# Patient Record
Sex: Male | Born: 1935 | Marital: Married | Smoking: Former smoker
Health system: Southern US, Community
[De-identification: ages and names within clinical notes are randomized; demographics above are authoritative.]

## PROBLEM LIST (undated history)

## (undated) DIAGNOSIS — K635 Polyp of colon: Secondary | ICD-10-CM

## (undated) DIAGNOSIS — I509 Heart failure, unspecified: Secondary | ICD-10-CM

## (undated) DIAGNOSIS — Z87442 Personal history of urinary calculi: Secondary | ICD-10-CM

## (undated) DIAGNOSIS — J449 Chronic obstructive pulmonary disease, unspecified: Secondary | ICD-10-CM

## (undated) DIAGNOSIS — I2510 Atherosclerotic heart disease of native coronary artery without angina pectoris: Secondary | ICD-10-CM

## (undated) DIAGNOSIS — I4891 Unspecified atrial fibrillation: Secondary | ICD-10-CM

## (undated) DIAGNOSIS — J479 Bronchiectasis, uncomplicated: Secondary | ICD-10-CM

## (undated) HISTORY — PX: OTHER SURGICAL HISTORY: SHX169

## (undated) HISTORY — DX: Polyp of colon: K63.5

## (undated) HISTORY — DX: Personal history of urinary calculi: Z87.442

## (undated) HISTORY — DX: Bronchiectasis, uncomplicated: J47.9

## (undated) HISTORY — DX: Atherosclerotic heart disease of native coronary artery without angina pectoris: I25.10

## (undated) HISTORY — PX: COLONOSCOPY: SHX174

## (undated) HISTORY — PX: CATARACT EXTRACTION, BILATERAL: SHX1313

## (undated) HISTORY — DX: Unspecified atrial fibrillation: I48.91

## (undated) HISTORY — DX: Heart failure, unspecified: I50.9

## (undated) HISTORY — DX: Chronic obstructive pulmonary disease, unspecified: J44.9

---

## 2013-02-06 DIAGNOSIS — I369 Nonrheumatic tricuspid valve disorder, unspecified: Secondary | ICD-10-CM

## 2013-02-07 ENCOUNTER — Other Ambulatory Visit: Payer: Self-pay | Admitting: Cardiovascular Disease

## 2013-02-07 DIAGNOSIS — I509 Heart failure, unspecified: Secondary | ICD-10-CM

## 2013-02-07 DIAGNOSIS — I4891 Unspecified atrial fibrillation: Secondary | ICD-10-CM

## 2013-02-08 DIAGNOSIS — I4892 Unspecified atrial flutter: Secondary | ICD-10-CM

## 2013-02-11 DIAGNOSIS — I4891 Unspecified atrial fibrillation: Secondary | ICD-10-CM

## 2013-03-02 ENCOUNTER — Encounter: Payer: Self-pay | Admitting: Pulmonary Disease

## 2013-03-02 ENCOUNTER — Ambulatory Visit (INDEPENDENT_AMBULATORY_CARE_PROVIDER_SITE_OTHER): Payer: Medicare Other | Admitting: Pulmonary Disease

## 2013-03-02 VITALS — BP 120/60 | HR 67 | Temp 97.5°F | Ht 72.0 in | Wt 133.0 lb

## 2013-03-02 DIAGNOSIS — J449 Chronic obstructive pulmonary disease, unspecified: Secondary | ICD-10-CM

## 2013-03-02 DIAGNOSIS — J479 Bronchiectasis, uncomplicated: Secondary | ICD-10-CM

## 2013-03-02 NOTE — Assessment & Plan Note (Signed)
Due to prior heavy tobacco use.  GOLD grade D based on severity of symptoms.  I do not believe that he is having a flare of his COPD today, but it seems that his bronchiectasis is flaring now.  Overall, he seems very sick from a respiratory standpoint and has had a significant functional decline in the last year.  Plan: -continue spiriva, advair -continue 4L O2 continuously -pulmonary rehab referral

## 2013-03-02 NOTE — Assessment & Plan Note (Signed)
According to the patient and the minimal records I have available, it appears that Mr. 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  I explained to him today that he needs to continue the monthly IVIg as coordinated by his physician's from [facility].  The three hallmarks of good bronchiectasis care are 1) good nutrition, 2) airway clearance, 3) focus on pulmonary microbiology.    I believe that he has a flare of his bronchiectasis right now, and would benefit from a longer course of antibiotics.  Plan: -advised to start using a flutter valve tid -use Levaquin for 14 days -after completing the Levaquin, he needs sputum cultures sent for bacterial and afb -d/c prednisone for now -continue spiriva, advair, albuterol -encouraged ensure with meals and to follow dietician instructions carefully

## 2013-03-02 NOTE — Progress Notes (Signed)
Subjective:    Patient ID: Johnny Wilson, male    DOB: Feb 04, 1931, 79 y.o.   MRN: [id number]  HPI This is a pleasant 79 y/o male who lives in [place] who has CVID and bronchiectasis who is coming to [place] to establish care for the same.  He states that he has been on IVIg through [facility] for 6-7 years for the CVID under the direction of a rheumatologist.  He has chronic pseudomonas colonization and has required treatment of bronchiectasis flares with IV antibiotics through a PICC line for several weeks in the past.  He has never seen [facility] pulmonary, to his knowledge.   He smoked 1-1.5 ppd for at least 55 years and quit in 2002.    In the last several months he has had a significant decline in his health and has been admitted to both [facility] and [facility] for several weeks for both exacerbations of CHF and bronchiectasis.  He has been on oxygen during this time at 4L continuosly.  He was discharged to a nursing facility several weeks ago and has been participating in rehab there on a regular basis.  He is due to go home this week.  He would like to establish care in [place] with our clinic and Dr. Windell Hummingbird cardiology clinic because we are in a more convenient location than [facility].  He states that he has a cough on a daily basis productive of grey sputum and he cannot walk further than 50 yards without stopping due to dyspnea.  His weight has been stable around 133-135 lbs in the nursing home.  He is eating ensure with meals.  Though his legs were swollen during his recent CHF exacerbations, this symptom has been better in the last few weeks.  He uses spiriva, advair, and his oxygen regularly.  He does not use a flutter valve regularly.    Past Medical History  Diagnosis Date  . COPD (chronic obstructive pulmonary disease)      History reviewed. No pertinent family history.   History   Social History  . Marital Status: Married    Spouse Name: N/A    Number of Children: N/A  . Years of Education: N/A    Occupational History  . Not on file.   Social History Main Topics  . Smoking status: Former Smoker -- 1.50 packs/day for 55 years    Types: Cigarettes    Quit date: 12/31/2002  . Smokeless tobacco: Former Neurosurgeon    Types: Chew  . Alcohol Use: No  . Drug Use: No  . Sexually Active: Not on file   Other Topics Concern  . Not on file   Social History Narrative  . No narrative on file     Allergies not on file   No outpatient prescriptions prior to visit.   No facility-administered medications prior to visit.      Review of Systems  Constitutional: Negative for fever and unexpected weight change.  HENT: Positive for congestion, trouble swallowing, postnasal drip and sinus pressure. Negative for ear pain, nosebleeds, sore throat, rhinorrhea, sneezing and dental problem.   Eyes: Positive for redness and itching.  Respiratory: Positive for cough and shortness of breath. Negative for chest tightness and wheezing.   Cardiovascular: Positive for palpitations and leg swelling.  Gastrointestinal: Negative for nausea and vomiting.  Genitourinary: Negative for dysuria.  Musculoskeletal: Negative for joint swelling.  Skin: Negative for rash.  Neurological: Negative for headaches.  Hematological: Bruises/bleeds easily.  Psychiatric/Behavioral: Positive for dysphoric mood. The  patient is nervous/anxious.        Objective:   Physical Exam  Filed Vitals:   03/02/13 1013  BP: 120/60  Pulse: 67  Temp: 97.5 F (36.4 C)  TempSrc: Oral  Height: 6' (1.829 m)  Weight: 133 lb (60.328 kg)  SpO2: 96%  On 4L [place]  Gen: chronically ill appearing, no acute distress HEENT: NCAT, OP clear, neck supple without masses Eyes:  PERRL, EOMi PULM: Insp, coarse crackles from bases to 1/2 way up bilaterally, no wheezing; normal percussion bilaterally CV: RRR, no mgr, no JVD AB: BS+, soft, nontender, no hsm Ext: warm, trace pretibial edema, + clubbing, no cyanosis Derm: no rash or skin  breakdown Neuro: A&Ox4, CN II-XII intact, strength 5/5 in all 4 extremities  03/02/13 Simple spirometry> clear obstruction, FEV1 1.45 L     Assessment & Plan:   Bronchiectasis without acute exacerbation According to the patient and the minimal records I have available, it appears that Johnny Wilson 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  I explained to him today that he needs to continue the monthly IVIg as coordinated by his physician's from [facility].  The three hallmarks of good bronchiectasis care are 1) good nutrition, 2) airway clearance, 3) focus on pulmonary microbiology.    I believe that he has a flare of his bronchiectasis right now, and would benefit from a longer course of antibiotics.  Plan: -advised to start using a flutter valve tid -use Levaquin for 14 days -after completing the Levaquin, he needs sputum cultures sent for bacterial and afb -d/c prednisone for now -continue spiriva, advair, albuterol -encouraged ensure with meals and to follow dietician instructions carefully  COPD with chronic bronchitis Due to prior heavy tobacco use.  GOLD grade D based on severity of symptoms.  I do not believe that he is having a flare of his COPD today, but it seems that his bronchiectasis is flaring now.  Overall, he seems very sick from a respiratory standpoint and has had a significant functional decline in the last year.  Plan: -continue spiriva, advair -continue 4L O2 continuously -pulmonary rehab referral   Updated Medication List Outpatient Encounter Prescriptions as of 03/02/2013  Medication Sig Dispense Refill  . dextromethorphan-guaiFENesin (ROBITUSSIN COLD COUGH+ CHEST) 10-100 MG/5ML liquid Take 5 mLs by mouth every 4 (four) hours as needed for cough.      . digoxin (LANOXIN) 0.125 MG tablet Take 0.125 mg by mouth daily.      Marland Kitchen diltiazem (DILACOR XR) 240 MG 24 hr capsule Take 240 mg by mouth daily.       . fluconazole (DIFLUCAN) 100 MG tablet Take 100 mg by mouth daily.      . furosemide (LASIX) 20 MG tablet Take 20 mg by mouth daily.      . predniSONE (DELTASONE) 5 MG tablet Take 5 mg by mouth daily.      . Rivaroxaban (XARELTO) 20 MG TABS Take 20 mg by mouth daily.      Marland Kitchen tiotropium (SPIRIVA) 18 MCG inhalation capsule Place 18 mcg into inhaler and inhale daily.       No facility-administered encounter medications on file as of 03/02/2013.

## 2013-03-02 NOTE — Patient Instructions (Signed)
Take the levaquin 750mg  daily through 4/25 Stop taking the prednisone Use your flutter valve three times a day Start going to pulmonary rehab at [facility] (we will place a referral)  We will see you back in 2 weeks in the [place] office

## 2013-03-04 ENCOUNTER — Encounter: Payer: Medicare Other | Admitting: Cardiovascular Disease

## 2013-03-09 ENCOUNTER — Ambulatory Visit (INDEPENDENT_AMBULATORY_CARE_PROVIDER_SITE_OTHER): Payer: Medicare Other | Admitting: Cardiovascular Disease

## 2013-03-09 ENCOUNTER — Encounter: Payer: Self-pay | Admitting: Cardiovascular Disease

## 2013-03-09 VITALS — BP 120/68 | HR 84 | Ht 72.0 in | Wt 135.5 lb

## 2013-03-09 DIAGNOSIS — I5032 Chronic diastolic (congestive) heart failure: Secondary | ICD-10-CM | POA: Insufficient documentation

## 2013-03-09 DIAGNOSIS — I359 Nonrheumatic aortic valve disorder, unspecified: Secondary | ICD-10-CM

## 2013-03-09 DIAGNOSIS — I4891 Unspecified atrial fibrillation: Secondary | ICD-10-CM

## 2013-03-09 DIAGNOSIS — J449 Chronic obstructive pulmonary disease, unspecified: Secondary | ICD-10-CM

## 2013-03-09 DIAGNOSIS — I350 Nonrheumatic aortic (valve) stenosis: Secondary | ICD-10-CM | POA: Insufficient documentation

## 2013-03-09 DIAGNOSIS — I509 Heart failure, unspecified: Secondary | ICD-10-CM

## 2013-03-09 MED ORDER — RIVAROXABAN 20 MG PO TABS: 20.0000 mg | ORAL_TABLET | Freq: Every day | ORAL | Status: DC

## 2013-03-09 NOTE — Patient Instructions (Addendum)
You are doing well. No medication changes were made.  Call the office if your ankles are swelling more, We would have you take extra lasix for fluid retention  Lab work today  Please call us if you have new issues that need to be addressed before your next appt.  Your physician wants you to follow-up in: 3 months.  You will receive a reminder letter in the mail two months in advance. If you don't receive a letter, please call our office to schedule the follow-up appointment.

## 2013-03-09 NOTE — Assessment & Plan Note (Signed)
Mild aortic valve stenosis. We'll continue rate control and Lasix daily.

## 2013-03-09 NOTE — Assessment & Plan Note (Signed)
He is being followed by Dr. Kendrick Fries. On 4 L oxygen. Using Advair, Spiriva, albuterol

## 2013-03-09 NOTE — Assessment & Plan Note (Signed)
Continue Lasix daily. No edema on today's visit. We'll check a basic metabolic panel.

## 2013-03-09 NOTE — Assessment & Plan Note (Signed)
Currently in normal sinus rhythm. High risk of developing atrial fibrillation again given his significant underlying COPD. We'll continue him on anticoagulation for now. Unable to exclude paroxysmal atrial fibrillation.

## 2013-03-09 NOTE — Progress Notes (Signed)
Patient ID: Johnny Wilson, male    DOB: August 01, 1931, 79 y.o.   MRN: [id number]  HPI Comments: Johnny Wilson is an 79 year old gentleman with severe COPD, long smoking history,on chronic oxygen, chronic atrial fibrillation, diastolic CHF, recent admission to [facility] for diastolic CHF, pulmonary hypertension treated with diuretics and steroids and discharged to nursing facility, noted to have atrial fibrillation with RVR and readmitted to [facility]. Heart rate 135 beats per minute. He was dropping his oxygen saturation level to the high 60s.  CT scan showedSevere atelectasis, patchy infiltrates, bilateral pleural effusions. He was started on diltiazem infusion, changed to diltiazem CD by mouth and digoxin. Also started on xarelto 20 mg daily. He was discharged to [facility] the head for 3 weeks. Now living at home.  He is still weak, but much stronger than several weeks ago given his long hospital course. Overall has no complaints. He has lost significant weight through his hospital course. Appetite is adequate.  Echocardiogram in the hospital shows ejection fraction 55-60%, mild aortic valve stenosis, mild pulmonary hypertension Normal creatinine  EKG in the hospital showed atrial flutter with variable block, ventricular rate 92 beats per minute  EKG today shows normal sinus rhythm with frequent APCs, rate 84 beats per minute   Outpatient Encounter Prescriptions as of 03/09/2013  Medication Sig Dispense Refill  . digoxin (LANOXIN) 0.125 MG tablet Take 0.125 mg by mouth daily.      Marland Kitchen diltiazem (DILACOR XR) 240 MG 24 hr capsule Take 240 mg by mouth daily.      . furosemide (LASIX) 20 MG tablet Take 20 mg by mouth daily.      Marland Kitchen ipratropium-albuterol (DUONEB) 0.5-2.5 (3) MG/3ML SOLN Take 3 mLs by nebulization as needed.      . Rivaroxaban (XARELTO) 20 MG TABS Take 1 tablet (20 mg total) by mouth daily.  30 tablet  6  . tiotropium (SPIRIVA) 18 MCG inhalation capsule Place 18 mcg into inhaler and inhale daily.         Review of Systems  Constitutional: Negative.   HENT: Negative.   Eyes: Negative.   Respiratory: Positive for shortness of breath and wheezing.   Cardiovascular: Negative.   Gastrointestinal: Negative.   Musculoskeletal: Negative.   Skin: Negative.   Neurological: Negative.   Psychiatric/Behavioral: Negative.   All other systems reviewed and are negative.    BP 120/68  Pulse 84  Ht 6' (1.829 m)  Wt 135 lb 8 oz (61.462 kg)  BMI 18.37 kg/m2  Physical Exam  Nursing note and vitals reviewed. Constitutional: He is oriented to person, place, and time.  An elderly gentleman on oxygen  HENT:  Head: Normocephalic.  Nose: Nose normal.  Mouth/Throat: Oropharynx is clear and moist.  Eyes: Conjunctivae are normal. Pupils are equal, round, and reactive to light.  Neck: Normal range of motion. Neck supple. No JVD present.  Cardiovascular: Normal rate, regular rhythm, S1 normal, S2 normal, normal heart sounds and intact distal pulses.   Occasional extrasystoles are present. Exam reveals no gallop and no friction rub.   No murmur heard. Pulmonary/Chest: Effort normal. No respiratory distress. He has decreased breath sounds. He has no wheezes. He has no rales. He exhibits no tenderness.  Abdominal: Soft. Bowel sounds are normal. He exhibits no distension. There is no tenderness.  Musculoskeletal: Normal range of motion. He exhibits no edema and no tenderness.  Lymphadenopathy:    He has no cervical adenopathy.  Neurological: He is alert and oriented to person, place, and  time. Coordination normal.  Skin: Skin is warm and dry. No rash noted. No erythema.  Psychiatric: He has a normal mood and affect. His behavior is normal. Judgment and thought content normal.      Assessment and Plan

## 2013-03-10 LAB — BASIC METABOLIC PANEL
BUN: 43 mg/dL — ABNORMAL HIGH (ref 8–27)
Chloride: 99 mmol/L (ref 97–108)
GFR calc Af Amer: 93 mL/min/{1.73_m2} (ref >59)
GFR calc non Af Amer: 81 mL/min/{1.73_m2} (ref >59)
Glucose: 73 mg/dL (ref 65–99)

## 2013-03-19 ENCOUNTER — Other Ambulatory Visit: Payer: Self-pay | Admitting: Emergency Medicine

## 2013-03-19 ENCOUNTER — Telehealth: Payer: Self-pay

## 2013-03-19 ENCOUNTER — Other Ambulatory Visit: Payer: Self-pay | Admitting: Family Medicine

## 2013-03-19 DIAGNOSIS — I4891 Unspecified atrial fibrillation: Secondary | ICD-10-CM

## 2013-03-19 DIAGNOSIS — I5033 Acute on chronic diastolic (congestive) heart failure: Secondary | ICD-10-CM

## 2013-03-19 NOTE — Telephone Encounter (Signed)
Pt's dtr called Concerned b/c pt's HR=[id number] this am She is checking this via pulse ox. I had dtr check HR radially while I was on the phone. She confirms pulse is "too fast to count" O2 level=77 % on 2L [place] C/o feeling SOB I advised she call 911 and have pt go to ER Understanding verb She will do this now

## 2013-03-19 NOTE — Telephone Encounter (Signed)
EMS at pt's home Says they do not think pt needs to go to ER Says HR is sinus tachycardia ranging from 120-140 BPM O2 sats are reading 74% on 2LO2, but EMT states, "I dont believe this is accurate" I discussed with Dr. Kirke Corin who says pt should still be evaluated in ER EMS was informed and will transport to [facility]

## 2013-03-24 ENCOUNTER — Other Ambulatory Visit: Payer: Self-pay | Admitting: Cardiovascular Disease

## 2013-03-24 ENCOUNTER — Encounter: Payer: Self-pay | Admitting: *Deleted

## 2013-03-24 ENCOUNTER — Other Ambulatory Visit: Payer: Self-pay

## 2013-03-24 ENCOUNTER — Ambulatory Visit: Payer: Medicare Other | Admitting: Pulmonary Disease

## 2013-03-24 DIAGNOSIS — I4891 Unspecified atrial fibrillation: Secondary | ICD-10-CM

## 2013-03-24 MED ORDER — AMIODARONE HCL 400 MG PO TABS: ORAL_TABLET | ORAL | Status: DC

## 2013-03-24 MED ORDER — AMIODARONE HCL 200 MG PO TABS: 200.0000 mg | ORAL_TABLET | Freq: Two times a day (BID) | ORAL | Status: DC

## 2013-03-30 ENCOUNTER — Encounter: Payer: Self-pay | Admitting: Pulmonary Disease

## 2013-03-30 ENCOUNTER — Encounter: Payer: Self-pay | Admitting: Physician Assistant

## 2013-03-30 ENCOUNTER — Ambulatory Visit (INDEPENDENT_AMBULATORY_CARE_PROVIDER_SITE_OTHER): Payer: Medicare Other | Admitting: Pulmonary Disease

## 2013-03-30 ENCOUNTER — Ambulatory Visit (INDEPENDENT_AMBULATORY_CARE_PROVIDER_SITE_OTHER): Payer: Medicare Other | Admitting: Physician Assistant

## 2013-03-30 VITALS — BP 120/56 | HR 80 | Ht 72.0 in | Wt 139.2 lb

## 2013-03-30 VITALS — BP 110/60 | HR 82 | Temp 97.9°F | Ht 72.0 in | Wt 138.0 lb

## 2013-03-30 DIAGNOSIS — J329 Chronic sinusitis, unspecified: Secondary | ICD-10-CM

## 2013-03-30 DIAGNOSIS — J9610 Chronic respiratory failure, unspecified whether with hypoxia or hypercapnia: Secondary | ICD-10-CM | POA: Insufficient documentation

## 2013-03-30 DIAGNOSIS — I480 Paroxysmal atrial fibrillation: Secondary | ICD-10-CM | POA: Insufficient documentation

## 2013-03-30 DIAGNOSIS — I4891 Unspecified atrial fibrillation: Secondary | ICD-10-CM

## 2013-03-30 DIAGNOSIS — J449 Chronic obstructive pulmonary disease, unspecified: Secondary | ICD-10-CM

## 2013-03-30 DIAGNOSIS — I5032 Chronic diastolic (congestive) heart failure: Secondary | ICD-10-CM

## 2013-03-30 DIAGNOSIS — I350 Nonrheumatic aortic (valve) stenosis: Secondary | ICD-10-CM

## 2013-03-30 DIAGNOSIS — J479 Bronchiectasis, uncomplicated: Secondary | ICD-10-CM

## 2013-03-30 DIAGNOSIS — J4489 Other specified chronic obstructive pulmonary disease: Secondary | ICD-10-CM

## 2013-03-30 DIAGNOSIS — I359 Nonrheumatic aortic valve disorder, unspecified: Secondary | ICD-10-CM

## 2013-03-30 DIAGNOSIS — I509 Heart failure, unspecified: Secondary | ICD-10-CM

## 2013-03-30 MED ORDER — FUROSEMIDE 20 MG PO TABS: 20.0000 mg | ORAL_TABLET | Freq: Every day | ORAL | Status: DC

## 2013-03-30 MED ORDER — AMIODARONE HCL 200 MG PO TABS: 200.0000 mg | ORAL_TABLET | Freq: Every day | ORAL | Status: DC

## 2013-03-30 MED ORDER — FLUTICASONE PROPIONATE 50 MCG/ACT NA SUSP: 1.0000 | Freq: Every day | NASAL | Status: AC

## 2013-03-30 MED ORDER — FLUTICASONE-SALMETEROL 250-50 MCG/DOSE IN AEPB: 2.0000 | INHALATION_SPRAY | Freq: Two times a day (BID) | RESPIRATORY_TRACT | Status: AC

## 2013-03-30 MED ORDER — RIVAROXABAN 20 MG PO TABS: 20.0000 mg | ORAL_TABLET | Freq: Every day | ORAL | Status: DC

## 2013-03-30 MED ORDER — FLUTICASONE-SALMETEROL 250-50 MCG/DOSE IN AEPB: 2.0000 | INHALATION_SPRAY | Freq: Two times a day (BID) | RESPIRATORY_TRACT | Status: DC

## 2013-03-30 MED ORDER — TIOTROPIUM BROMIDE MONOHYDRATE 18 MCG IN CAPS: 18.0000 ug | ORAL_CAPSULE | Freq: Every day | RESPIRATORY_TRACT | Status: AC

## 2013-03-30 MED ORDER — TIOTROPIUM BROMIDE MONOHYDRATE 18 MCG IN CAPS: 18.0000 ug | ORAL_CAPSULE | Freq: Every day | RESPIRATORY_TRACT | Status: DC

## 2013-03-30 NOTE — Assessment & Plan Note (Signed)
Add regular saline rinses/sprays

## 2013-03-30 NOTE — Patient Instructions (Addendum)
You are remaining in a normal rhythm today.   Continue to take amiodarone, Xarelto and Lasix as prescribed.   Please continue to wear compression stockings, monitor weights daily, and limit salt and fluid intake.   Please follow-up with your primary doctor in 1-2 weeks and request labwork for your thyroid since starting amiodarone.   We will see you back in 3 months.

## 2013-03-30 NOTE — Assessment & Plan Note (Signed)
Lungs clear. No appreciable wheezing, rales or rhonchi. Breathing subjectively at baseline per patient.

## 2013-03-30 NOTE — Assessment & Plan Note (Signed)
Euvolemic today. Breathing markedly improved. Continue low-dose Lasix. Discussed CHF management/monitoring in detail including daily weights, compression stockings, salt/fluid restriction. Advised to consume potassium-rich foods.

## 2013-03-30 NOTE — Assessment & Plan Note (Signed)
Rate controlled today. Continue amiodarone per Dr. Mariah Milling

## 2013-03-30 NOTE — Assessment & Plan Note (Signed)
This is due to CVID and is severe based on his oxygen use, frequent exacerbations, and airflow obstruction.  Plan: -encouraged good nutrition/weight gain, Ensure with meals -change flutter valve to IPV device 2-3 x/day -sputum culture next visit -IgE next visit (look for ABPA, currently on prednisone so unlikely to be helpful if drawn today)

## 2013-03-30 NOTE — Progress Notes (Signed)
Subjective:    Patient ID: Johnny Wilson, male    DOB: 01-21-1931, 79 y.o.   MRN: [id number]  Synopsis: Shuayb Schepers establish care with the [facility] pulmonary medicine in April of 2014. He has bronchiectasis do to combined variable immunodeficiency and he has received immunoglobulin therapy monthly at [facility] for years. He also has COPD, atrial fibrillation, and CHF. 2013 3 2014 have been exceedingly difficult with multiple hospitalizations, a nursing home stay, and increasing oxygen requirements.  HPI  03/30/2013 ROV -- Unfortunately Chavis has been admitted since our last visit. He says that his cough and sputum production have actually improved, but however on May 1 he woke up in the morning acutely short of breath with a very fast and irregular heart rate. He was admitted to the CCU at [facility] where his heart rate was controlled and he was told that he had multilobar pneumonia. He was treated with Levaquin, amiodarone, and prednisone. He was discharged on a prednisone taper and amiodarone. He states his breathing is a little better since the last visit. His weight is up without significant change and leg swelling. His cough has nearly completely resolved and he does not make sputum. The flutter valve did not help with sputum production. He continues to use and benefit from 4 L of oxygen continuously.  Past Medical History  Diagnosis Date  . COPD (chronic obstructive pulmonary disease)   . CHF (congestive heart failure)   . Bronchiectasis   . CAD (coronary artery disease)   . History of kidney stones   . A-fib      Review of Systems  Constitutional: Positive for fatigue. Negative for fever and chills.  HENT: Positive for congestion, rhinorrhea, postnasal drip and sinus pressure. Negative for sneezing.   Respiratory: Positive for cough and shortness of breath. Negative for wheezing.   Cardiovascular: Positive for palpitations and leg swelling. Negative for chest pain.        Objective:   Physical Exam  Filed Vitals:   03/30/13 1140  BP: 110/60  Pulse: 82  Temp: 97.9 F (36.6 C)  TempSrc: Oral  Height: 6' (1.829 m)  Weight: 138 lb (62.596 kg)  SpO2: 92%   4L [place]  Gen: chronically ill appearing, no acute distress HEENT: NCAT, EOMi, OP clear,  PULM: crackles in bases, minimal wheezing CV: Regular rate, irregular rhythm, no mgr, no JVD AB: BS+, soft, nontender, no hsm Ext: warm, compression stocking in place, no clubbing, no cyanosis      Assessment & Plan:   Bronchiectasis without acute exacerbation This is due to CVID and is severe based on his oxygen use, frequent exacerbations, and airflow obstruction.  Plan: -encouraged good nutrition/weight gain, Ensure with meals -change flutter valve to IPV device 2-3 x/day -sputum culture next visit -IgE next visit (look for ABPA, currently on prednisone so unlikely to be helpful if drawn today)  COPD with chronic bronchitis Severe based on symptoms, frequent hospitalizations, oxygen needs.  Plan: -continue spiriva and advair -continue oxygen -start Pulmonary rehab this week  Atrial fibrillation Rate controlled today. Continue amiodarone per Dr. Mariah Milling  Sinusitis, chronic Add regular saline rinses/sprays    Updated Medication List Outpatient Encounter Prescriptions as of 03/30/2013  Medication Sig Dispense Refill  . amiodarone (PACERONE) 200 MG tablet Take 1 tablet (200 mg total) by mouth 2 (two) times daily.  60 tablet  6  . aspirin 81 MG tablet Take 81 mg by mouth daily.      . Fluticasone-Salmeterol (  ADVAIR) 250-50 MCG/DOSE AEPB Inhale 2 puffs into the lungs every 12 (twelve) hours.  60 each  2  . furosemide (LASIX) 20 MG tablet Take 20 mg by mouth daily.      Marland Kitchen ipratropium-albuterol (DUONEB) 0.5-2.5 (3) MG/3ML SOLN Take 3 mLs by nebulization as needed.      . predniSONE (DELTASONE) 20 MG tablet Take 20 mg by mouth daily.      . Rivaroxaban (XARELTO) 20 MG TABS Take 1 tablet  (20 mg total) by mouth daily.  30 tablet  6  . tiotropium (SPIRIVA) 18 MCG inhalation capsule Place 1 capsule (18 mcg total) into inhaler and inhale daily.  30 capsule  2  . [DISCONTINUED] digoxin (LANOXIN) 0.125 MG tablet Take 0.125 mg by mouth daily.      . [DISCONTINUED] diltiazem (CARDIZEM SR) 60 MG 12 hr capsule Take 60 mg by mouth daily.      . [DISCONTINUED] Fluticasone-Salmeterol (ADVAIR) 250-50 MCG/DOSE AEPB Inhale 2 puffs into the lungs every 12 (twelve) hours.      . [DISCONTINUED] Fluticasone-Salmeterol (ADVAIR) 250-50 MCG/DOSE AEPB Inhale 2 puffs into the lungs every 12 (twelve) hours.  60 each  2  . [DISCONTINUED] levofloxacin (LEVAQUIN) 500 MG tablet Take 500 mg by mouth daily.      . [DISCONTINUED] tiotropium (SPIRIVA) 18 MCG inhalation capsule Place 18 mcg into inhaler and inhale daily.      . [DISCONTINUED] tiotropium (SPIRIVA) 18 MCG inhalation capsule Place 1 capsule (18 mcg total) into inhaler and inhale daily.  30 capsule  2  . fluticasone (FLONASE) 50 MCG/ACT nasal spray Place 1 spray into the nose daily.  16 g  2   No facility-administered encounter medications on file as of 03/30/2013.

## 2013-03-30 NOTE — Assessment & Plan Note (Signed)
Mild by recent echo. Continue to monitor.

## 2013-03-30 NOTE — Assessment & Plan Note (Signed)
Severe based on symptoms, frequent hospitalizations, oxygen needs.  Plan: -continue spiriva and advair -continue oxygen -start Pulmonary rehab this week

## 2013-03-30 NOTE — Assessment & Plan Note (Signed)
Maintaining NSR on low-dose amiodarone. Tolerating well. Will need to keep a close eye on pulmonary status. LFTs on last admission WNL. We discussed checked TFTs. He has a follow-up appointment with his PCP within a week, and prefers to request thyroid studies be performed at this time. Continue Xarelto.

## 2013-03-30 NOTE — Patient Instructions (Addendum)
We will work to get you an IPV nebulizer machine (Intermittent Percussive Ventilator) to use three to four times a day with albuterol  In the meantime, keep using the flutter valve  Use mucinex twice a day  Follow up with pulmonary rehab  Keep taking your spiriva and advair as you are doing  Keep using your oxygen at 4 L continuously  Use saline rinses (Neil Med rinses or [employer]) with distilled water twice a day   We will see you back in 4-6 weeks or sooner if needed

## 2013-03-30 NOTE — Progress Notes (Signed)
Patient ID: Johnny Wilson, male    DOB: Apr 15, 1931, 79 y.o.   MRN: [id number]  HPI Comments: Mr. Lamere is an 79 year old gentleman with PMHx significant for chronic respiratory failure- O2 dependent COPD with pulmonary HTN, severe bronchiectasis, common variable immunodeficiency snydrome, chronic diastolic CHF and paroxysmal atrial fibrillation (on chronic Xarelto anticoagulation).    He was admitted to [facility] 03/19/13 to 03/24/13 for atrial fibrillation with RVR in the setting of acute on chronic respiratory failure. He had a similar admission the previous month. Over the course of 1 week, he reported experiencing increased SOB, cough productive of grey sputum and fevers/chills. On ED presentation, EKG revealed atrial fibrillation/flutter with RVR, rate 150s. WBC 25K. CXR indicated features c/w R mid and upper lung consolidation concerning for pneumonia. D-dimer WNL. He was started on amiodarone gtt, broad spectrum abx and pulmonary toilet. Outpatient Cardizem PO was continued. Digoxin held. He shortly converted back to NSR, however BPs were labile. Cardizem was held and amiodarone transitioned to PO with sustained NSR. He was resumed on Xarelto. He was discharged on PO amiodarone load (400 mg BID) to maintenance dose (200mg  daily). He did develop mild CHF decompensation which improved with low-dose Lasix.   Echo 02/2013: LVEF 55-60%, mild AS, mild pulmonary HTN  He is maintaining NSR on follow-up today. His breathing is much improved-- at baseline. No more cough. No PND, orthopnea, chest pain, palpitations or LE edema. He recently followed up with Dr. Kendrick Fries and received a good pulmonary evaluation. Tolerating medication changes well. Would like refills. LFTs unremarkable on recent admission.  EKG demonstrates NSR, 80 bpm, borderline 1st degree AVB, no ST/T changes.  BP 120/56.  Current medications   Medication Sig Start Date End Date Taking? Authorizing Provider  amiodarone (PACERONE) 200 MG  tablet Take 1 tablet (200 mg total) by mouth daily. 03/30/13  Yes Katessa Attridge A Valori Hollenkamp, PA-C  aspirin 81 MG tablet Take 81 mg by mouth daily.   Yes Historical Provider, MD  fluticasone (FLONASE) 50 MCG/ACT nasal spray Place 1 spray into the nose daily. 03/30/13 03/30/14 Yes Lupita Leash, MD  Fluticasone-Salmeterol (ADVAIR) 250-50 MCG/DOSE AEPB Inhale 2 puffs into the lungs every 12 (twelve) hours. 03/30/13  Yes Lupita Leash, MD  furosemide (LASIX) 20 MG tablet Take 1 tablet (20 mg total) by mouth daily. 03/30/13  Yes Joziah Dollins A Brynlee Pennywell, PA-C  ipratropium-albuterol (DUONEB) 0.5-2.5 (3) MG/3ML SOLN Take 3 mLs by nebulization as needed.   Yes Historical Provider, MD  NON FORMULARY Oxygen 4 liters daily.   Yes Historical Provider, MD  predniSONE (DELTASONE) 20 MG tablet Take 20 mg by mouth daily. Does not take on Wednesday.   Yes Historical Provider, MD  Rivaroxaban (XARELTO) 20 MG TABS Take 1 tablet (20 mg total) by mouth daily. 03/30/13  Yes Kemo Spruce A Cerina Leary, PA-C  tiotropium (SPIRIVA) 18 MCG inhalation capsule Place 1 capsule (18 mcg total) into inhaler and inhale daily. 03/30/13  Yes Lupita Leash, MD     Review of Systems  Constitutional: Negative.  Negative for unexpected weight change.  HENT: Negative.   Eyes: Negative.   Respiratory: Positive for shortness of breath and wheezing.   Cardiovascular: Negative.  Negative for chest pain, palpitations and leg swelling.  Gastrointestinal: Negative.   Musculoskeletal: Negative.   Skin: Negative.   Neurological: Negative.  Negative for dizziness, syncope and light-headedness.  Psychiatric/Behavioral: Negative.   All other systems reviewed and are negative.    BP 120/56  Pulse 80  Ht 6' (  1.829 m)  Wt 139 lb 4 oz (63.163 kg)  BMI 18.88 kg/m2  Physical Exam  Nursing note and vitals reviewed. Constitutional: He is oriented to person, place, and time.  An elderly gentleman on oxygen  HENT:  Head: Normocephalic.  Nose: Nose normal.   Mouth/Throat: Oropharynx is clear and moist.  Eyes: Conjunctivae are normal. Pupils are equal, round, and reactive to light.  Neck: Normal range of motion. Neck supple. No JVD present.  Cardiovascular: Normal rate, regular rhythm, S1 normal, S2 normal, normal heart sounds and intact distal pulses.   Occasional extrasystoles are present. Exam reveals no gallop and no friction rub.   No murmur heard. Pulmonary/Chest: Effort normal. No respiratory distress. He has decreased breath sounds. He has no wheezes. He has no rales. He exhibits no tenderness.  Abdominal: Soft. Bowel sounds are normal. He exhibits no distension. There is no tenderness.  Musculoskeletal: Normal range of motion. He exhibits no edema and no tenderness.  Lymphadenopathy:    He has no cervical adenopathy.  Neurological: He is alert and oriented to person, place, and time. Coordination normal.  Skin: Skin is warm and dry. No rash noted. No erythema.  Psychiatric: He has a normal mood and affect. His behavior is normal. Judgment and thought content normal.      Assessment and Plan

## 2013-04-07 ENCOUNTER — Telehealth: Payer: Self-pay

## 2013-04-07 NOTE — Telephone Encounter (Signed)
Pt's dtr called with concerns about pt's meds. Says he was d/c from [facility] on amiodarone 400 mg BID until 5/8 then he was to switch to 200 mg daily.  He came to see Dr. Kendrick Fries 5/12 am and Dr. Kendrick Fries d/c instructions included amiodarone 200 mg BID.  They then came to see [place] that same day and saw PA. Our AVS says amiodarone 200 mg daily.  Pt had been taking amiodarone 200 mg daily RX says "200 mg twice daily" Dtr/wife is concerned and confused about how much amiodarone he is to be taking  I reviewed d/c notes from [facility] and Dr. Elease Hashimoto states pt should take amiodarone 400 mg BID until 5/8 and then decrease to 200 mg daily. This is what pt had been taking I explained, according to Dr. Ulyses Jarred note, pt's medications were entered as if he was taking amiodarone 200 mg BID instead of how he was taking it (200 mg daily) so that is why AVS still says "200 mg twice daily"; Kendrick Fries is wanting Dr. Mariah Milling to continue to manage. I advised, since hospital d/c states pt should now be taking 200 mg daily and this is how he was originally taking it, he should continue 200 mg daily Understanding verb

## 2013-04-07 NOTE — Telephone Encounter (Signed)
FYI

## 2013-04-07 NOTE — Telephone Encounter (Signed)
Agree, thx! 

## 2013-04-08 ENCOUNTER — Encounter: Payer: Self-pay | Admitting: Pulmonary Disease

## 2013-04-28 ENCOUNTER — Ambulatory Visit: Payer: Medicare Other | Admitting: Pulmonary Disease

## 2013-05-04 ENCOUNTER — Telehealth: Payer: Self-pay | Admitting: Pulmonary Disease

## 2013-05-04 NOTE — Telephone Encounter (Signed)
Spoke to pt's daughter. Pt was at [employer] today at [facility]. While do his exercises his O2 level fell to 81%. His daughter states that they have an appointment with BQ tomorrow for a regular ROV and will talk to him about this more in detail then.

## 2013-05-05 ENCOUNTER — Telehealth: Payer: Self-pay | Admitting: *Deleted

## 2013-05-05 ENCOUNTER — Encounter: Payer: Self-pay | Admitting: Pulmonary Disease

## 2013-05-05 ENCOUNTER — Ambulatory Visit (INDEPENDENT_AMBULATORY_CARE_PROVIDER_SITE_OTHER)
Admission: RE | Admit: 2013-05-05 | Discharge: 2013-05-05 | Disposition: A | Discharge status: Home / Self Care | Payer: Medicare Other | Source: Ambulatory Visit | Attending: Pulmonary Disease | Admitting: Pulmonary Disease

## 2013-05-05 ENCOUNTER — Ambulatory Visit (INDEPENDENT_AMBULATORY_CARE_PROVIDER_SITE_OTHER): Payer: Medicare Other | Admitting: Pulmonary Disease

## 2013-05-05 VITALS — BP 104/56 | HR 66 | Temp 97.7°F | Ht 72.0 in | Wt 131.0 lb

## 2013-05-05 DIAGNOSIS — R0602 Shortness of breath: Secondary | ICD-10-CM

## 2013-05-05 DIAGNOSIS — R9389 Abnormal findings on diagnostic imaging of other specified body structures: Secondary | ICD-10-CM

## 2013-05-05 DIAGNOSIS — J449 Chronic obstructive pulmonary disease, unspecified: Secondary | ICD-10-CM

## 2013-05-05 DIAGNOSIS — J471 Bronchiectasis with (acute) exacerbation: Secondary | ICD-10-CM

## 2013-05-05 MED ORDER — LEVOFLOXACIN 500 MG PO TABS: 500.0000 mg | ORAL_TABLET | Freq: Every day | ORAL | Status: AC

## 2013-05-05 MED ORDER — PREDNISONE 10 MG PO TABS: ORAL_TABLET | ORAL | Status: DC

## 2013-05-05 NOTE — Assessment & Plan Note (Signed)
We increased his oxygen to 4 L at rest, 6 L with exertion. He is to continue taking his Spiriva, and Advair as written.

## 2013-05-05 NOTE — Telephone Encounter (Signed)
Can we ask around the office to see if anyone else knows about it?  Here is a website for a device like what I'm talking about: [employer]

## 2013-05-05 NOTE — Assessment & Plan Note (Signed)
As noted above, in March 2014 CT chest at [facility] showed emphysema, significant bronchiectasis, and scarring in the right base. He needs to have a repeat CT chest in one to 2 months to review the scar in the right base again.

## 2013-05-05 NOTE — Assessment & Plan Note (Signed)
Unfortunately, I believe that Johnny Wilson is having another flare of bronchiectasis as his sputum production is picked up as has his oxygen requirements and shortness of breath.  While he appears to have very severe disease as evidenced by his multiple recent hospitalizations, I wonder whether or not there is an underlying atypical mycobacterial infection or allergic problem which is contributing to this. However, the most likely explanation is progression of his bronchiectasis and COPD to a very severe level.   I talked to his family and to him very frankly today about whether or not to continue with aggressive therapy versus considering hospice. I advised I would prefer to continue to look for and treat any cause that is making his bronchiectasis worse. He agreed with that plan for today.  Plan: -Sputum culture -IgE to look for ABPA -Prednisone taper -Levaquin -Chest x-ray -He may need to have another CT scan of his chest as the previous one showed "scarring". There are no clear findings suggestive of malignancy but he did have a scar in the right base which I think needs to be followed.

## 2013-05-05 NOTE — Progress Notes (Signed)
Subjective:    Patient ID: ZAIDE KARDELL, male    DOB: 1931/08/27, 79 y.o.   MRN: [id number]  Synopsis: Jamonta Goerner establish care with the [facility] pulmonary medicine in April of 2014. He has bronchiectasis do to combined variable immunodeficiency and he has received immunoglobulin therapy monthly at [facility] for years. He also has COPD, atrial fibrillation, and CHF. 2013 3 2014 have been exceedingly difficult with multiple hospitalizations, a nursing home stay, and increasing oxygen requirements.  HPI   03/30/2013 ROV -- Unfortunately Damare has been admitted since our last visit. He says that his cough and sputum production have actually improved, but however on May 1 he woke up in the morning acutely short of breath with a very fast and irregular heart rate. He was admitted to the CCU at [facility] where his heart rate was controlled and he was told that he had multilobar pneumonia. He was treated with Levaquin, amiodarone, and prednisone. He was discharged on a prednisone taper and amiodarone. He states his breathing is a little better since the last visit. His weight is up without significant change and leg swelling. His cough has nearly completely resolved and he does not make sputum. The flutter valve did not help with sputum production. He continues to use and benefit from 4 L of oxygen continuously.  05/05/2013 ROV >> Elsie Lincoln has had increasing cough with mucus production in the last 3-4 weeks. This is been particularly bad in the last several days. He has been coughing up more sputum from baseline. His green to brown in color. Is not bloody. This has not been associated with fevers. He does have some increased shortness of breath. His ankle swelling has been slightly worse in the last couple days. He continues to take his Lasix. He uses his oxygen but his family notes that sometimes he will not use it for several minutes at a time. They also note that he does not always use his  medications regularly. He has been going to pulmonary rehabilitation a regular basis.   Past Medical History  Diagnosis Date  . COPD (chronic obstructive pulmonary disease)   . CHF (congestive heart failure)   . Bronchiectasis   . CAD (coronary artery disease)   . History of kidney stones   . A-fib      Review of Systems  Constitutional: Positive for fatigue. Negative for fever and chills.  HENT: Positive for congestion, rhinorrhea, postnasal drip and sinus pressure. Negative for sneezing.   Respiratory: Positive for cough and shortness of breath. Negative for wheezing.   Cardiovascular: Positive for palpitations and leg swelling. Negative for chest pain.       Objective:   Physical Exam   Filed Vitals:   05/05/13 0906  BP: 104/56  Pulse: 66  Temp: 97.7 F (36.5 C)  TempSrc: Oral  Height: 6' (1.829 m)  Weight: 131 lb (59.421 kg)  SpO2: 93%   4L [place]  Walked 500 feet on 6 L nasal cannula and never dropped O2 saturation below 91%  Gen: chronically ill appearing, no acute distress HEENT: NCAT, EOMi, OP clear,  PULM: crackles in bases, minimal wheezing CV: Regular rate, irregular rhythm, no mgr, no JVD AB: BS+, soft, nontender, no hsm Ext: warm, trace edema in ankles bilaterally, no clubbing, no cyanosis      Assessment & Plan:   Bronchiectasis with acute exacerbation Unfortunately, I believe that Aylen is having another flare of bronchiectasis as his sputum production is picked up as  has his oxygen requirements and shortness of breath.  While he appears to have very severe disease as evidenced by his multiple recent hospitalizations, I wonder whether or not there is an underlying atypical mycobacterial infection or allergic problem which is contributing to this. However, the most likely explanation is progression of his bronchiectasis and COPD to a very severe level.   I talked to his family and to him very frankly today about whether or not to continue with  aggressive therapy versus considering hospice. I advised I would prefer to continue to look for and treat any cause that is making his bronchiectasis worse. He agreed with that plan for today.  Plan: -Sputum culture -IgE to look for ABPA -Prednisone taper -Levaquin -Chest x-ray -He may need to have another CT scan of his chest as the previous one showed "scarring". There are no clear findings suggestive of malignancy but he did have a scar in the right base which I think needs to be followed.  Abnormal chest CT As noted above, in March 2014 CT chest at [facility] showed emphysema, significant bronchiectasis, and scarring in the right base. He needs to have a repeat CT chest in one to 2 months to review the scar in the right base again.  COPD with chronic bronchitis  We increased his oxygen to 4 L at rest, 6 L with exertion. He is to continue taking his Spiriva, and Advair as written.    Updated Medication List Outpatient Encounter Prescriptions as of 05/05/2013  Medication Sig Dispense Refill  . amiodarone (PACERONE) 200 MG tablet Take 1 tablet (200 mg total) by mouth daily.  30 tablet  3  . aspirin 81 MG tablet Take 81 mg by mouth daily.      . fluticasone (FLONASE) 50 MCG/ACT nasal spray Place 1 spray into the nose daily.  16 g  2  . Fluticasone-Salmeterol (ADVAIR) 250-50 MCG/DOSE AEPB Inhale 2 puffs into the lungs every 12 (twelve) hours.  60 each  2  . furosemide (LASIX) 20 MG tablet Take 1 tablet (20 mg total) by mouth daily.  30 tablet  3  . ipratropium-albuterol (DUONEB) 0.5-2.5 (3) MG/3ML SOLN Take 3 mLs by nebulization as needed.      . NON FORMULARY Oxygen 4 liters daily.      . predniSONE (DELTASONE) 20 MG tablet Take 20 mg by mouth daily. Does not take on Wednesday.      . Rivaroxaban (XARELTO) 20 MG TABS Take 1 tablet (20 mg total) by mouth daily.  30 tablet  3  . tiotropium (SPIRIVA) 18 MCG inhalation capsule Place 1 capsule (18 mcg total) into inhaler and inhale  daily.  30 capsule  2   No facility-administered encounter medications on file as of 05/05/2013.

## 2013-05-05 NOTE — Telephone Encounter (Signed)
Please let him know that the CXR was not much different from his old studies I have reviewed at [facility].  He will need a CT chest in 6 weeks (no contrast, reason> R lung scarring).  Please order this.  Thanks

## 2013-05-05 NOTE — Telephone Encounter (Signed)
[facility] does not have IPV(nebulizer device). I will forward this to [facility] to send to another DME that may have this. Please advise. Carron Curie, CMA

## 2013-05-05 NOTE — Telephone Encounter (Signed)
Please advise CXR results Dr Kendrick Fries.  Call report from [place] at [facility].  Message will be forwarded again to [facility] with website per [facility]

## 2013-05-05 NOTE — Telephone Encounter (Signed)
Please advise CXR results Dr Kendrick Fries.  Call report from [place] at [facility].

## 2013-05-05 NOTE — Patient Instructions (Signed)
Take the prednisone and Levaquin as written  Use your oxygen at 4 liters at rest, 6 liters with exertion  We will write a prescription for an oxygen concentrator for you  Use the IPV device at least twice a day with the duoneb  Continue to take your medicines as written  We will call you with the results of your blood work  We will send you for a Chest X-ray.  You may need another CT chest depending on the results, we will call you to let you know  Take an extra dose of lasix for two days  We will see you back in 2-4 weeks or sooner if needed

## 2013-05-05 NOTE — Telephone Encounter (Signed)
Pam from [facility] medical supply called stating we sent them an order for pt to get an IPV device, but they do not have these.  Pam can be reached @ [phone]. Leanora Ivanoff

## 2013-05-06 NOTE — Telephone Encounter (Signed)
lmtcb x1 w/ spouse 

## 2013-05-06 NOTE — Telephone Encounter (Signed)
ATC x1  NA

## 2013-05-06 NOTE — Telephone Encounter (Signed)
Left message to call back with the IPV  Company [employer]

## 2013-05-06 NOTE — Telephone Encounter (Signed)
Pt returned call. [phone]. Johnny Wilson

## 2013-05-07 NOTE — Telephone Encounter (Signed)
Patient aware of results of cxr. Refused to schedule CT at this time. Would like to call back and let us know at a later date if this is something that she wants to do. Pt did not want me to put order in for CT at this time.  Pt also has decided to not change his neb machine.   Will send message to [facility] as FYI of pt decision.

## 2013-05-07 NOTE — Telephone Encounter (Signed)
Pt decided not to ger IPV Nebulizer [employer]

## 2013-05-07 NOTE — Telephone Encounter (Signed)
noted 

## 2013-05-08 LAB — RESPIRATORY CULTURE OR RESPIRATORY AND SPUTUM CULTURE: Organism ID, Bacteria: NORMAL

## 2013-05-11 ENCOUNTER — Other Ambulatory Visit: Payer: Medicare Other

## 2013-05-15 ENCOUNTER — Other Ambulatory Visit: Payer: Medicare Other

## 2013-05-19 ENCOUNTER — Ambulatory Visit (INDEPENDENT_AMBULATORY_CARE_PROVIDER_SITE_OTHER): Payer: Medicare Other | Admitting: Pulmonary Disease

## 2013-05-19 ENCOUNTER — Encounter: Payer: Self-pay | Admitting: Pulmonary Disease

## 2013-05-19 VITALS — BP 120/64 | HR 70 | Temp 98.3°F | Ht 72.0 in | Wt 132.0 lb

## 2013-05-19 DIAGNOSIS — J479 Bronchiectasis, uncomplicated: Secondary | ICD-10-CM

## 2013-05-19 DIAGNOSIS — R911 Solitary pulmonary nodule: Secondary | ICD-10-CM

## 2013-05-19 DIAGNOSIS — R9389 Abnormal findings on diagnostic imaging of other specified body structures: Secondary | ICD-10-CM

## 2013-05-19 DIAGNOSIS — J449 Chronic obstructive pulmonary disease, unspecified: Secondary | ICD-10-CM

## 2013-05-19 MED ORDER — BENZONATATE 200 MG PO CAPS: 200.0000 mg | ORAL_CAPSULE | Freq: Three times a day (TID) | ORAL | Status: DC | PRN

## 2013-05-19 MED ORDER — TOBRAMYCIN 300 MG/5ML IN NEBU: 300.0000 mg | INHALATION_SOLUTION | Freq: Two times a day (BID) | RESPIRATORY_TRACT | Status: DC

## 2013-05-19 NOTE — Progress Notes (Signed)
Subjective:    Patient ID: Johnny Wilson, male    DOB: [birth year], 79 y.o.   MRN: [id number]  Synopsis: Yigit Norkus establish care with the [facility] pulmonary medicine in April of 2014. He has bronchiectasis do to combined variable immunodeficiency and he has received immunoglobulin therapy monthly at [facility] for years. He also has COPD, atrial fibrillation, and CHF. 2013 3 2014 have been exceedingly difficult with multiple hospitalizations, a nursing home stay, and increasing oxygen requirements.  HPI   03/30/2013 ROV -- Unfortunately Camillo has been admitted since our last visit. He says that his cough and sputum production have actually improved, but however on May 1 he woke up in the morning acutely short of breath with a very fast and irregular heart rate. He was admitted to the CCU at [facility] where his heart rate was controlled and he was told that he had multilobar pneumonia. He was treated with Levaquin, amiodarone, and prednisone. He was discharged on a prednisone taper and amiodarone. He states his breathing is a little better since the last visit. His weight is up without significant change and leg swelling. His cough has nearly completely resolved and he does not make sputum. The flutter valve did not help with sputum production. He continues to use and benefit from 4 L of oxygen continuously.  05/05/2013 ROV >> Geovanie has had increasing cough with mucus production in the last 3-4 weeks. This is been particularly bad in the last several days. He has been coughing up more sputum from baseline. His green to brown in color. Is not bloody. This has not been associated with fevers. He does have some increased shortness of breath. His ankle swelling has been slightly worse in the last couple days. He continues to take his Lasix. He uses his oxygen but his family notes that sometimes he will not use it for several minutes at a time. They also note that he does not always use his  medications regularly. He has been going to pulmonary rehabilitation a regular basis.  05/19/2013 ROV >> Devan says that he is doing OK since the last visit. He took the Levaquin and his cough has improved. He does have nighttime cough which is not productive of sputum but wakes him up frequently. He has a hard time getting around because of shortness of breath. He has been participating in pulmonary rehabilitation regularly his oxygen level has been better now that he is using 6 L of oxygen with exertion and 4 L at rest. He thinks his weight is about the same.    Past Medical History  Diagnosis Date  . COPD (chronic obstructive pulmonary disease)   . CHF (congestive heart failure)   . Bronchiectasis   . CAD (coronary artery disease)   . History of kidney stones   . A-fib      Review of Systems  Constitutional: Positive for fatigue. Negative for fever and chills.  HENT: Positive for congestion, rhinorrhea, postnasal drip and sinus pressure. Negative for sneezing.   Respiratory: Positive for cough and shortness of breath. Negative for wheezing.   Cardiovascular: Positive for palpitations and leg swelling. Negative for chest pain.       Objective:   Physical Exam   Filed Vitals:   05/19/13 1104  BP: 120/64  Pulse: 70  Temp: 98.3 F (36.8 C)  TempSrc: Oral  Height: 6' (1.829 m)  Weight: 132 lb (59.875 kg)  SpO2: 92%   4L [place]   Gen: chronically  ill appearing, no acute distress HEENT: NCAT, EOMi, OP clear,  PULM: crackles in R base, no wheezing CV: Regular rate, irregular rhythm, no mgr, no JVD AB: BS+, soft, nontender, no hsm Ext: warm, trace edema in ankles bilaterally, no clubbing, no cyanosis      Assessment & Plan:   Bronchiectasis without acute exacerbation Steve has been doing better since the last visit after we treated him with Levaquin. I am concerned about the recurrent bronchiectasis flares in fear that this represents end-stage disease.  Plan: -Start  tobramycin inhaled every other month to suppress bronchiectasis flares -Continue Spiriva, Advair  COPD with chronic bronchitis Continue Spiriva, Advair. Continue pulmonary rehabilitation.  Abnormal chest CT CT chest ordered to evaluate the scarring in the right base, we will see him after this.    Updated Medication List Outpatient Encounter Prescriptions as of 05/19/2013  Medication Sig Dispense Refill  . amiodarone (PACERONE) 200 MG tablet Take 1 tablet (200 mg total) by mouth daily.  30 tablet  3  . aspirin 81 MG tablet Take 81 mg by mouth daily.      . fluticasone (FLONASE) 50 MCG/ACT nasal spray Place 1 spray into the nose daily.  16 g  2  . Fluticasone-Salmeterol (ADVAIR) 250-50 MCG/DOSE AEPB Inhale 2 puffs into the lungs every 12 (twelve) hours.  60 each  2  . furosemide (LASIX) 20 MG tablet Take 1 tablet (20 mg total) by mouth daily.  30 tablet  3  . ipratropium-albuterol (DUONEB) 0.5-2.5 (3) MG/3ML SOLN Take 3 mLs by nebulization as needed.      . NON FORMULARY Oxygen 4 liters daily.      . predniSONE (DELTASONE) 20 MG tablet Take 20 mg by mouth daily. Does not take on Wednesday.      . Rivaroxaban (XARELTO) 20 MG TABS Take 1 tablet (20 mg total) by mouth daily.  30 tablet  3  . tiotropium (SPIRIVA) 18 MCG inhalation capsule Place 1 capsule (18 mcg total) into inhaler and inhale daily.  30 capsule  2  . [DISCONTINUED] predniSONE (DELTASONE) 10 MG tablet Take 40mg  po daily for 3 days, then take 30mg  po daily for 3 days, then take 20mg  po daily for two days, then take 10mg  po daily for 2 days  30 tablet  0   No facility-administered encounter medications on file as of 05/19/2013.

## 2013-05-19 NOTE — Patient Instructions (Signed)
Take the Tobi podhaler 4 capsules twice a day for the rest of the month  We will start the prescription process for Tobi inhaled  Keep taking your medications as you are doing  Use the Tessalon perles to help suppress the cough at night  Try to get your weight up as much as possible  Keep using your other medicines as you are doing  We will send you for a CT scan within the next few weeks  We will see you back in 2-3 months sooner if needed

## 2013-05-19 NOTE — Assessment & Plan Note (Signed)
CT chest ordered to evaluate the scarring in the right base, we will see him after this.

## 2013-05-19 NOTE — Assessment & Plan Note (Signed)
Tevis has been doing better since the last visit after we treated him with Levaquin. I am concerned about the recurrent bronchiectasis flares in fear that this represents end-stage disease.  Plan: -Start tobramycin inhaled every other month to suppress bronchiectasis flares -Continue Spiriva, Advair

## 2013-05-19 NOTE — Assessment & Plan Note (Signed)
Continue Spiriva, Advair. Continue pulmonary rehabilitation.

## 2013-05-29 ENCOUNTER — Telehealth: Payer: Self-pay | Admitting: *Deleted

## 2013-05-29 ENCOUNTER — Encounter: Payer: Self-pay | Admitting: Pulmonary Disease

## 2013-05-29 LAB — AFB CULTURE AND SMEAR,BROTH

## 2013-05-29 NOTE — Telephone Encounter (Signed)
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

## 2013-05-29 NOTE — Telephone Encounter (Signed)
Noted, thanks!

## 2013-05-29 NOTE — Telephone Encounter (Signed)
Will forward to Dr Kendrick Fries so he is aware

## 2013-06-02 LAB — FUNGUS CULTURE W SMEAR: Smear Result: NONE SEEN

## 2013-06-03 ENCOUNTER — Other Ambulatory Visit: Payer: Self-pay | Admitting: Pulmonary Disease

## 2013-06-03 ENCOUNTER — Telehealth: Payer: Self-pay | Admitting: *Deleted

## 2013-06-03 ENCOUNTER — Other Ambulatory Visit: Payer: Self-pay | Admitting: Internal Medicine

## 2013-06-03 DIAGNOSIS — J479 Bronchiectasis, uncomplicated: Secondary | ICD-10-CM

## 2013-06-03 NOTE — Progress Notes (Signed)
Quick Note:  Spoke with the pt's daughter and notified the pt needs to provide [place] with 2 more sputum samples Apparently he was not given 2 additional cups to take home with him, so I left 2 up front for him to pick up and drop off on 2 separate days Lab orders were placed ______

## 2013-06-03 NOTE — Telephone Encounter (Signed)
Called [facility], susceptability testing is going to be added, [facility] will be faxing over a Add on Authorization to be signed by the provider

## 2013-06-03 NOTE — Progress Notes (Signed)
Quick Note:  Spoke with Johnny Wilson in the lab dept here in [place] clinic Sh is going to call the resulting lab agency and ask for susceptability testing ______

## 2013-06-09 ENCOUNTER — Encounter: Payer: Self-pay | Admitting: Cardiovascular Disease

## 2013-06-09 ENCOUNTER — Ambulatory Visit (INDEPENDENT_AMBULATORY_CARE_PROVIDER_SITE_OTHER): Payer: Medicare Other | Admitting: Cardiovascular Disease

## 2013-06-09 VITALS — BP 127/67 | HR 63 | Ht 72.0 in | Wt 131.2 lb

## 2013-06-09 DIAGNOSIS — I4891 Unspecified atrial fibrillation: Secondary | ICD-10-CM

## 2013-06-09 DIAGNOSIS — I350 Nonrheumatic aortic (valve) stenosis: Secondary | ICD-10-CM

## 2013-06-09 DIAGNOSIS — J479 Bronchiectasis, uncomplicated: Secondary | ICD-10-CM

## 2013-06-09 DIAGNOSIS — Z9181 History of falling: Secondary | ICD-10-CM

## 2013-06-09 DIAGNOSIS — I5032 Chronic diastolic (congestive) heart failure: Secondary | ICD-10-CM

## 2013-06-09 DIAGNOSIS — I359 Nonrheumatic aortic valve disorder, unspecified: Secondary | ICD-10-CM

## 2013-06-09 DIAGNOSIS — I509 Heart failure, unspecified: Secondary | ICD-10-CM

## 2013-06-09 DIAGNOSIS — I480 Paroxysmal atrial fibrillation: Secondary | ICD-10-CM

## 2013-06-09 MED ORDER — AMIODARONE HCL 200 MG PO TABS: 200.0000 mg | ORAL_TABLET | Freq: Every day | ORAL | Status: DC

## 2013-06-09 NOTE — Assessment & Plan Note (Signed)
Chronic cough on today's visit, stable. Followed by Dr. Kendrick Fries.

## 2013-06-09 NOTE — Progress Notes (Signed)
Patient ID: Johnny Wilson, male    DOB: 1931-08-16, 79 y.o.   MRN: [id number]  HPI Comments: Johnny Wilson is an 79 year old gentleman with PMHx significant for chronic respiratory failure- O2 dependent COPD with pulmonary HTN, severe bronchiectasis, common variable immunodeficiency snydrome, chronic diastolic CHF and paroxysmal atrial fibrillation (on chronic Xarelto anticoagulation).    COPD exacerbation with atrial fibrillation in February 2014   admitted to [facility] 03/19/13 to 03/24/13 for atrial fibrillation with RVR in the setting of acute on chronic respiratory failure.  EKG revealed atrial fibrillation/flutter with RVR, rate 150s. WBC 25K.  He was started on amiodarone gtt, broad spectrum abx and pulmonary toilet. Outpatient Cardizem PO was continued. Digoxin held. He shortly converted back to NSR, however BPs were labile. Cardizem was held and amiodarone transitioned to PO with sustained NSR. He was restarted on Xarelto. He was discharged on PO amiodarone load (400 mg BID) to maintenance dose (200mg  daily). He did develop mild CHF decompensation which improved with low-dose Lasix.   In followup today, he is maintaining normal sinus rhythm. He was in normal sinus rhythm in April 2014. He has a large hematoma on his bottom lip over the past 4 days. He does not know how he received this injury. Also reports having some light nosebleeds. Walks with oxygen, fall risk per the family.  He does have a chronic cough, stable  Echo 02/2013: LVEF 55-60%, mild AS, mild pulmonary HTN  EKG demonstrates NSR, 63 bpm, borderline 1st degree AVB, no ST/T changes.  Outpatient Encounter Prescriptions as of 06/09/2013  Medication Sig Dispense Refill  . amiodarone (PACERONE) 200 MG tablet Take 1 tablet (200 mg total) by mouth daily.  90 tablet  1  . aspirin 81 MG tablet Take 81 mg by mouth daily.      . benzonatate (TESSALON) 200 MG capsule Take 1 capsule (200 mg total) by mouth 3 (three) times daily as needed for  cough.  30 capsule  1  . fluticasone (FLONASE) 50 MCG/ACT nasal spray Place 1 spray into the nose daily.  16 g  2  . Fluticasone-Salmeterol (ADVAIR) 250-50 MCG/DOSE AEPB Inhale 2 puffs into the lungs every 12 (twelve) hours.  60 each  2  . furosemide (LASIX) 20 MG tablet Take 1 tablet (20 mg total) by mouth daily.  30 tablet  3  . ipratropium-albuterol (DUONEB) 0.5-2.5 (3) MG/3ML SOLN Take 3 mLs by nebulization as needed.      . NON FORMULARY Oxygen 4 liters daily.      . Rivaroxaban (XARELTO) 20 MG TABS Take 1 tablet (20 mg total) by mouth daily.  30 tablet  3  . tiotropium (SPIRIVA) 18 MCG inhalation capsule Place 1 capsule (18 mcg total) into inhaler and inhale daily.  30 capsule  2    Review of Systems  Constitutional: Negative.  Negative for unexpected weight change.  HENT: Negative.   Eyes: Negative.   Respiratory: Positive for shortness of breath.   Cardiovascular: Negative.  Negative for chest pain, palpitations and leg swelling.  Gastrointestinal: Negative.   Musculoskeletal: Positive for gait problem.  Skin: Negative.   Neurological: Negative.  Negative for dizziness, syncope and light-headedness.  Psychiatric/Behavioral: Negative.   All other systems reviewed and are negative.    BP 127/67  Pulse 63  Ht 6' (1.829 m)  Wt 131 lb 4 oz (59.535 kg)  BMI 17.8 kg/m2  Physical Exam  Nursing note and vitals reviewed. Constitutional: He is oriented to person, place, and time.  He appears well-developed and well-nourished.   on oxygen nasal cannula, frail  HENT:  Head: Normocephalic.  Nose: Nose normal.  Mouth/Throat: Oropharynx is clear and moist.  Eyes: Conjunctivae are normal. Pupils are equal, round, and reactive to light.  Neck: Normal range of motion. Neck supple. No JVD present.  Cardiovascular: Normal rate, regular rhythm, S1 normal, S2 normal, normal heart sounds and intact distal pulses.   Occasional extrasystoles are present. Exam reveals no gallop and no friction  rub.   No murmur heard. Pulmonary/Chest: Effort normal. No respiratory distress. He has decreased breath sounds. He has no wheezes. He has rales. He exhibits no tenderness.  Abdominal: Soft. Bowel sounds are normal. He exhibits no distension. There is no tenderness.  Musculoskeletal: Normal range of motion. He exhibits no edema and no tenderness.  Lymphadenopathy:    He has no cervical adenopathy.  Neurological: He is alert and oriented to person, place, and time. Coordination normal.  Skin: Skin is warm and dry. No rash noted. No erythema.  Psychiatric: He has a normal mood and affect. His behavior is normal. Judgment and thought content normal.      Assessment and Plan

## 2013-06-09 NOTE — Assessment & Plan Note (Signed)
We had a long discussion about the risks and benefits of anticoagulation. He is a high fall risk. Also with significant bruising, large hematoma on his bottom lip. Have suggested he hold his anticoagulation for now. This could be restarted for recurrent atrial fibrillation.

## 2013-06-09 NOTE — Assessment & Plan Note (Signed)
Mild aortic valve stenosis by echo in early 2014. No further testing at this time

## 2013-06-09 NOTE — Assessment & Plan Note (Signed)
Appears relatively euvolemic on today's visit. No edema. He takes Lasix daily. No changes to his medications

## 2013-06-09 NOTE — Patient Instructions (Addendum)
You are doing well.  Hold the xarelto for now, at least a week or two  If you have bad bruising or a fall,  Hold the xarelto   Monitor your heart rate at home  Please call us if you have new issues that need to be addressed before your next appt.  Your physician wants you to follow-up in: 3 months.  You will receive a reminder letter in the mail two months in advance. If you don't receive a letter, please call our office to schedule the follow-up appointment.

## 2013-06-09 NOTE — Assessment & Plan Note (Signed)
Maintaining normal sinus rhythm. Recent injury to his bottom lip, hematoma noted. We have suggested he hold his xarelto. This could be restarted for additional episodes of atrial fibrillation. He is a high fall risk, also having nosebleeds, significant bruising.

## 2013-06-15 ENCOUNTER — Other Ambulatory Visit: Payer: Self-pay | Admitting: *Deleted

## 2013-06-15 ENCOUNTER — Other Ambulatory Visit: Payer: Medicare Other

## 2013-06-15 DIAGNOSIS — J479 Bronchiectasis, uncomplicated: Secondary | ICD-10-CM

## 2013-06-16 ENCOUNTER — Telehealth: Payer: Self-pay | Admitting: *Deleted

## 2013-06-16 ENCOUNTER — Encounter: Payer: Self-pay | Admitting: Pulmonary Disease

## 2013-06-16 NOTE — Telephone Encounter (Signed)
Pt spouse advised of results. Telisha Zawadzki, CMA  

## 2013-06-16 NOTE — Telephone Encounter (Signed)
Message copied by Christen Butter on Tue Jun 16, 2013 10:05 AM ------      Message from: Max Fickle B      Created: Tue Jun 16, 2013  9:19 AM       L,            Please let him know that his CT looked good and was a littelb etter than before            Thanks,      B ------

## 2013-06-16 NOTE — Telephone Encounter (Signed)
LMTCB for the pt 

## 2013-06-16 NOTE — Telephone Encounter (Signed)
Pt's spouse is returning Leslie's call & asked to be reached at [phone].  Antionette Fairy

## 2013-06-19 LAB — SPECIMEN STATUS REPORT

## 2013-06-23 ENCOUNTER — Telehealth: Payer: Self-pay | Admitting: Pulmonary Disease

## 2013-06-23 MED ORDER — TOBRAMYCIN 28 MG IN CAPS: 4.0000 | ORAL_CAPSULE | Freq: Two times a day (BID) | RESPIRATORY_TRACT | Status: DC

## 2013-06-23 NOTE — Telephone Encounter (Signed)
Per last OV: Patient Instructions    Take the Tobi podhaler 4 capsules twice a day for the rest of the month  We will start the prescription process for Tobi inhaled  Plan:  -Start tobramycin inhaled every other month ------  [facility] called [facility]-spoke with Rhaishana. They need RX for pt tobi podhaler. She stated I needed to hold for a pharmacists to give them RX. I was then transferred to [facility] a pharmacists. Per Levert Feinstein this only comes in ampules (? Neb). Phone was d/c and when calling back to advised BQ does not want the neb he wants the podhaler -was on hold for another 8 minutes and was not able to reach a pharmacists. Will need to call back in the AM

## 2013-06-23 NOTE — Progress Notes (Signed)
Quick Note:  done ______ 

## 2013-06-24 NOTE — Telephone Encounter (Signed)
Per BQ's note on 05/19/13:  Plan:  -Start tobramycin inhaled every other month to suppress bronchiectasis flares  -Continue Spiriva, Advair  ------  Called, spoke with Pharmacist, Marisue Ivan.  During last OV, [facility] [place]  [facility] rx [facility] bc of it being a specialty med.  Marisue Ivan wanted to verify it is q12h for 28 months on and 28 months off.  Advised, yes this is correct according to last OV note with BQ.  Per Marisue Ivan, a PA will be needed for this medication.  She then transferred me to [place] who advised she would fax this PA form to triage.  Will await fax.

## 2013-06-25 NOTE — Telephone Encounter (Signed)
No PA form has been received yet. Will hold in triage and recheck fax machine in a little bit to see if we get it

## 2013-06-26 LAB — AFB CULTURE AND SMEAR,BROTH

## 2013-06-26 NOTE — Telephone Encounter (Signed)
Form received and this was placed in BQ yellow "look at" folder. Will forward to [place] to f/u on.

## 2013-06-26 NOTE — Telephone Encounter (Signed)
I have taken this form out of BQ's look at folder and completed myself. This will be faxed into [facility].

## 2013-06-30 ENCOUNTER — Encounter: Payer: Self-pay | Admitting: Pulmonary Disease

## 2013-07-21 ENCOUNTER — Encounter: Payer: Self-pay | Admitting: Pulmonary Disease

## 2013-07-21 ENCOUNTER — Ambulatory Visit (INDEPENDENT_AMBULATORY_CARE_PROVIDER_SITE_OTHER): Payer: Medicare Other | Admitting: Pulmonary Disease

## 2013-07-21 ENCOUNTER — Telehealth: Payer: Self-pay | Admitting: Pulmonary Disease

## 2013-07-21 VITALS — BP 110/56 | HR 65 | Temp 97.9°F | Ht 72.0 in | Wt 130.0 lb

## 2013-07-21 DIAGNOSIS — J449 Chronic obstructive pulmonary disease, unspecified: Secondary | ICD-10-CM

## 2013-07-21 DIAGNOSIS — J471 Bronchiectasis with (acute) exacerbation: Secondary | ICD-10-CM

## 2013-07-21 MED ORDER — LEVOFLOXACIN 750 MG PO TABS: 750.0000 mg | ORAL_TABLET | Freq: Every day | ORAL | Status: DC

## 2013-07-21 MED ORDER — LEVOFLOXACIN 750 MG PO TABS: 750.0000 mg | ORAL_TABLET | Freq: Every day | ORAL | Status: AC

## 2013-07-21 NOTE — Telephone Encounter (Signed)
[facility]. I have [facility] to local [facility]. Daughter is aware. [facility]. i was advised this was transferred to [facility] and then I was transferred. [facility] was not able to pull up pt d/t his account and was advised the specialty pharmacy needed to help me. She then transferred me [facility] to [facility]. I was advised they do not have anything on file for pt and I needed to call back to the specialty pharmacy. I was on hold already for 30 minutes so will call tomorrow

## 2013-07-21 NOTE — Assessment & Plan Note (Signed)
-  Continue Spiriva and Advair. -Continue oxygen 4 L at rest, 6 L with exertion -Continue pulmonary rehabilitation

## 2013-07-21 NOTE — Assessment & Plan Note (Signed)
Based on rales increased cough, mucus production and thickness I believe he is having another flare of bronchiectasis.  So far he has grown out mycobacterial organisms in one of 3 sputum cultures. We await the results of the final 3. I am encouraged by the fact that the CT scan of his chest several weeks ago did not show worsening disease. At this point, I cannot say definitively that he has an active mycobacterial infection.  Plan: -Levaquin for 2 weeks with a probiotic -Sputum culture and sensitivity for bacterial organisms -Continue to attempt to get inhale TOBI twice a day to suppress Pseudomonas and prevent recurrent flares and hospitalizations. -No role for daily azithromycin given the mycobacterial disease.

## 2013-07-21 NOTE — Patient Instructions (Signed)
Take the levaquin for 14 days, take it with yogurt and let us know if it is not working Please give [place] a sample of your sputum for a culture  Keep using your oxygen as you are doing, a goal O2 saturation is 91-94%  We will see you back in 6-8 weeks

## 2013-07-21 NOTE — Progress Notes (Signed)
Subjective:    Patient ID: Johnny Wilson, male    DOB: 26-Jan-1931, 79 y.o.   MRN: [id number]  Synopsis: [name] establish care with the [facility] pulmonary medicine in April of 2014. He has bronchiectasis do to combined variable immunodeficiency and he has received immunoglobulin therapy monthly at [facility] for years. He also has COPD, atrial fibrillation, and CHF. 2013 3 2014 have been exceedingly difficult with multiple hospitalizations, a nursing home stay, and increasing oxygen requirements.  HPI   03/30/2013 ROV -- Unfortunately [name] has been admitted since our last visit. He says that his cough and sputum production have actually improved, but however on May 1 he woke up in the morning acutely short of breath with a very fast and irregular heart rate. He was admitted to the CCU at [facility] where his heart rate was controlled and he was told that he had multilobar pneumonia. He was treated with Levaquin, amiodarone, and prednisone. He was discharged on a prednisone taper and amiodarone. He states his breathing is a little better since the last visit. His weight is up without significant change and leg swelling. His cough has nearly completely resolved and he does not make sputum. The flutter valve did not help with sputum production. He continues to use and benefit from 4 L of oxygen continuously.  05/05/2013 ROV >> [name] has had increasing cough with mucus production in the last 3-4 weeks. This is been particularly bad in the last several days. He has been coughing up more sputum from baseline. His green to brown in color. Is not bloody. This has not been associated with fevers. He does have some increased shortness of breath. His ankle swelling has been slightly worse in the last couple days. He continues to take his Lasix. He uses his oxygen but his family notes that sometimes he will not use it for several minutes at a time. They also note that he does not always use his  medications regularly. He has been going to pulmonary rehabilitation a regular basis.  05/19/2013 ROV >> [name] says that he is doing OK since the last visit. He took the Levaquin and his cough has improved. He does have nighttime cough which is not productive of sputum but wakes him up frequently. He has a hard time getting around because of shortness of breath. He has been participating in pulmonary rehabilitation regularly his oxygen level has been better now that he is using 6 L of oxygen with exertion and 4 L at rest. He thinks his weight is about the same.  07/21/2013 ROV >> [name] continues to participate in pulmonary rehabilitation regularly and uses oxygen at home as much as she can remember. He feels like things are going okay. However his daughter and his wife state that he frequently forgets to use his oxygen. Is not uncommon for them to find him with his oxygen off in that measure in oxygen saturation in the 70% range. He usually responds quickly to 6 L. Typically, they keep his oxygen concentrator on 6 L to catch up from all the episodes were he takes his nasal cannula off. It sounds like when he is at rest it he uses 4 L his oxygen saturation stays okay. He has noticed increasing cough and high volumes of sputum production in the last month. He has not had fevers or chills. His weight has roughly been stable in the last several months. He's not sure if the inhale TOBI helped much but he  only took it for a month. He continues to take all of his other inhalers.   Past Medical History  Diagnosis Date  . COPD (chronic obstructive pulmonary disease)   . CHF (congestive heart failure)   . Bronchiectasis   . CAD (coronary artery disease)   . History of kidney stones   . A-fib      Review of Systems  Constitutional: Positive for fatigue. Negative for fever and chills.  HENT: Positive for sinus pressure. Negative for congestion, rhinorrhea, sneezing and postnasal drip.   Respiratory: Positive  for cough and shortness of breath. Negative for wheezing.   Cardiovascular: Negative for chest pain, palpitations and leg swelling.       Objective:   Physical Exam   Filed Vitals:   07/21/13 1002  BP: 110/56  Pulse: 65  Temp: 97.9 F (36.6 C)  TempSrc: Oral  Height: 6' (1.829 m)  Weight: 130 lb (58.968 kg)  SpO2: 90%   6L [place]   Gen: chronically ill appearing, no acute distress HEENT: NCAT, EOMi, OP clear,  PULM: crackles in R base, some wheezing in bases CV: Regular rate, irregular rhythm, no mgr, no JVD AB: BS+, soft, nontender, no hsm Ext: warm, trace edema in ankles bilaterally, no clubbing, no cyanosis      Assessment & Plan:   Bronchiectasis with acute exacerbation Based on rales increased cough, mucus production and thickness I believe he is having another flare of bronchiectasis.  So far he has grown out mycobacterial organisms in one of 3 sputum cultures. We await the results of the final 3. I am encouraged by the fact that the CT scan of his chest several weeks ago did not show worsening disease. At this point, I cannot say definitively that he has an active mycobacterial infection.  Plan: -Levaquin for 2 weeks with a probiotic -Sputum culture and sensitivity for bacterial organisms -Continue to attempt to get inhale TOBI twice a day to suppress Pseudomonas and prevent recurrent flares and hospitalizations. -No role for daily azithromycin given the mycobacterial disease.  COPD with chronic bronchitis -Continue Spiriva and Advair. -Continue oxygen 4 L at rest, 6 L with exertion -Continue pulmonary rehabilitation    Updated Medication List Outpatient Encounter Prescriptions as of 07/21/2013  Medication Sig Dispense Refill  . amiodarone (PACERONE) 200 MG tablet Take 1 tablet (200 mg total) by mouth daily.  90 tablet  1  . aspirin 81 MG tablet Take 81 mg by mouth daily.      . benzonatate (TESSALON) 200 MG capsule Take 1 capsule (200 mg total) by mouth 3  (three) times daily as needed for cough.  30 capsule  1  . fluticasone (FLONASE) 50 MCG/ACT nasal spray Place 1 spray into the nose daily.  16 g  2  . Fluticasone-Salmeterol (ADVAIR) 250-50 MCG/DOSE AEPB Inhale 2 puffs into the lungs every 12 (twelve) hours.  60 each  2  . furosemide (LASIX) 20 MG tablet Take 1 tablet (20 mg total) by mouth daily.  30 tablet  3  . ipratropium-albuterol (DUONEB) 0.5-2.5 (3) MG/3ML SOLN Take 3 mLs by nebulization as needed.      . NON FORMULARY Oxygen 4 liters daily.      . Rivaroxaban (XARELTO) 20 MG TABS Take 1 tablet (20 mg total) by mouth daily.  30 tablet  3  . tiotropium (SPIRIVA) 18 MCG inhalation capsule Place 1 capsule (18 mcg total) into inhaler and inhale daily.  30 capsule  2  . Tobramycin (TOBI  PODHALER) 28 MG CAPS Place 4 capsules into inhaler and inhale 2 (two) times daily. For the rest of the month- every other month.  240 capsule  0   No facility-administered encounter medications on file as of 07/21/2013.

## 2013-07-22 NOTE — Telephone Encounter (Signed)
I spoke with Christiane Ha from [facility]. I was advised pt is not a customer of theres so if they did get RX they would not be able to fill it anyways. The specialty pharmacy does not ship these type of medications either. Nothing further needed

## 2013-07-23 ENCOUNTER — Other Ambulatory Visit: Payer: Self-pay | Admitting: Physician Assistant

## 2013-07-28 ENCOUNTER — Telehealth: Payer: Self-pay | Admitting: Pulmonary Disease

## 2013-07-28 ENCOUNTER — Encounter: Payer: Self-pay | Admitting: Pulmonary Disease

## 2013-07-28 LAB — AFB CULTURE WITH SMEAR (NOT AT ARMC)
Acid Fast Smear: NONE SEEN
Acid Fast Smear: NONE SEEN

## 2013-07-28 NOTE — Telephone Encounter (Signed)
Code is 494.0 Spoke with rep at [employer] and gave her this info She verbalized understanding and states nothing further needed

## 2013-07-29 ENCOUNTER — Other Ambulatory Visit: Payer: Medicare Other

## 2013-07-30 ENCOUNTER — Telehealth: Payer: Self-pay | Admitting: Pulmonary Disease

## 2013-07-30 NOTE — Telephone Encounter (Signed)
I spoke with Johnny Wilson at [phone]. She stated this is coming through as a paid claim and it may be the pharmacy is running it through wrong. If they still have problem with RX they need to call them at the # listed and speak with them bc it is coming back as this is a paid claim. I called [facility] back and spoke with Johnny Wilson. They were trying to run medication as neb mediation not podhaler. She re ran claim as Engineer, water and this went through. Nothing further needed

## 2013-08-01 LAB — LOWER RESPIRATORY CULTURE

## 2013-08-19 LAB — SPECIMEN STATUS REPORT

## 2013-09-01 ENCOUNTER — Ambulatory Visit: Payer: Medicare Other | Admitting: Pulmonary Disease

## 2013-09-02 ENCOUNTER — Other Ambulatory Visit: Payer: Self-pay | Admitting: Physician Assistant

## 2013-09-08 ENCOUNTER — Encounter: Payer: Self-pay | Admitting: Pulmonary Disease

## 2013-09-08 ENCOUNTER — Ambulatory Visit: Payer: Medicare Other | Admitting: Pulmonary Disease

## 2013-09-08 ENCOUNTER — Ambulatory Visit (INDEPENDENT_AMBULATORY_CARE_PROVIDER_SITE_OTHER): Payer: Medicare Other | Admitting: Pulmonary Disease

## 2013-09-08 VITALS — BP 110/60 | HR 66 | Ht 72.0 in | Wt 134.0 lb

## 2013-09-08 DIAGNOSIS — J471 Bronchiectasis with (acute) exacerbation: Secondary | ICD-10-CM

## 2013-09-08 DIAGNOSIS — J479 Bronchiectasis, uncomplicated: Secondary | ICD-10-CM

## 2013-09-08 DIAGNOSIS — J449 Chronic obstructive pulmonary disease, unspecified: Secondary | ICD-10-CM

## 2013-09-08 MED ORDER — TRAMADOL HCL 50 MG PO TABS: 50.0000 mg | ORAL_TABLET | Freq: Four times a day (QID) | ORAL | Status: AC | PRN

## 2013-09-08 NOTE — Patient Instructions (Signed)
Resume the Tobi once the IV antibiotic is complete Continue taking the tramadol as needed for cough Take a probiotic every day  We will see you back in 6-8 weeks

## 2013-09-08 NOTE — Assessment & Plan Note (Signed)
Continue Advair, Spiriva Flu shot UTD

## 2013-09-08 NOTE — Assessment & Plan Note (Signed)
I completely agree with the six weeks of ceftazidime prescribed by Johnny Wilson's physicians at [facility].  This seems to have helped.  Plan: -continue ceftaz to complete a six week course -start Tobi inhaled after completing ceftaz -probiotic daily -continue Advair/Spiriva

## 2013-09-08 NOTE — Progress Notes (Signed)
Subjective:    Patient ID: Johnny Wilson, male    DOB: [birth year], 79 y.o.   MRN: [id number]  Synopsis: Johnny Wilson establish care with the [facility] pulmonary medicine in April of 2014. 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 He also has COPD, atrial fibrillation, and CHF. 2013 3 2014 have been exceedingly difficult with multiple hospitalizations, a nursing home stay, and increasing oxygen requirements.  HPI   03/30/2013 ROV -- Unfortunately Johnny Wilson has been admitted since our last visit. He says that his cough and sputum production have actually improved, but however on May 1 he woke up in the morning acutely short of breath with a very fast and irregular heart rate. He was admitted to the CCU at [facility] where his heart rate was controlled and he was told that he had multilobar pneumonia. He was treated with Levaquin, amiodarone, and prednisone. He was discharged on a prednisone taper and amiodarone. He states his breathing is a little better since the last visit. His weight is up without significant change and leg swelling. His cough has nearly completely resolved and he does not make sputum. The flutter valve did not help with sputum production. He continues to use and benefit from 4 L of oxygen continuously.  05/05/2013 ROV >> Johnny Wilson has had increasing cough with mucus production in the last 3-4 weeks. This is been particularly bad in the last several days. He has been coughing up more sputum from baseline. His green to brown in color. Is not bloody. This has not been associated with fevers. He does have some increased shortness of breath. His ankle swelling has been slightly worse in the last couple days. He continues to take his Lasix. He uses his oxygen but his family notes that sometimes he will not use it for several minutes at a time. They also note that he does not always use his  medications regularly. He has been going to pulmonary rehabilitation a regular basis.  05/19/2013 ROV >> Johnny Wilson says that he is doing OK since the last visit. He took the Levaquin and his cough has improved. He does have nighttime cough which is not productive of sputum but wakes him up frequently. He has a hard time getting around because of shortness of breath. He has been participating in pulmonary rehabilitation regularly his oxygen level has been better now that he is using 6 L of oxygen with exertion and 4 L at rest. He thinks his weight is about the same.  07/21/2013 ROV >> Johnny Wilson continues to participate in pulmonary rehabilitation regularly and uses oxygen at home as much as she can remember. He feels like things are going okay. However his daughter and his wife state that he frequently forgets to use his oxygen. Is not uncommon for them to find him with his oxygen off in that measure in oxygen saturation in the 70% range. He usually responds quickly to 6 L. Typically, they keep his oxygen concentrator on 6 L to catch up from all the episodes were he takes his nasal cannula off. It sounds like when he is at rest it he uses 4 L his oxygen saturation stays okay. He has noticed increasing cough and high volumes of sputum production in the last month. He has not had fevers or chills. His weight has roughly been stable in the last several months. He's not sure if the inhale TOBI helped much but he  only took it for a month. He continues to take all of his other inhalers.  09/08/2013 ROV >>  Tuesday was admitted to [facility] for increased cough and a pulmonary infection.  He was given a PICC line and sent home with IV antibiotics to be given IV (ceftazidime).  He was also given a blood transfusion.  He had a colonoscopy during the hospitalization and had five polyps removed.   The plan is for him to be on the IV antibiotics for 6 weeks.  He says that the IV antibiotics have really helped.  He still has a cough every  day.  Past Medical History  Diagnosis Date  . COPD (chronic obstructive pulmonary disease)   . CHF (congestive heart failure)   . Bronchiectasis   . CAD (coronary artery disease)   . History of kidney stones   . A-fib      Review of Systems  Constitutional: Positive for fatigue. Negative for fever and chills.  HENT: Negative for congestion, postnasal drip, rhinorrhea, sinus pressure and sneezing.   Respiratory: Positive for cough. Negative for shortness of breath and wheezing.   Cardiovascular: Negative for chest pain, palpitations and leg swelling.       Objective:   Physical Exam   Filed Vitals:   09/08/13 1340  BP: 110/60  Pulse: 66  Height: 6' (1.829 m)  Weight: 134 lb (60.782 kg)  SpO2: 91%   6L [place]   Gen: chronically ill appearing, no acute distress HEENT: NCAT, EOMi, OP clear,  PULM: crackles in R base, no wheezing today CV: Regular rate, irregular rhythm, no mgr, no JVD AB: BS+, soft, nontender, no hsm Ext: warm, trace edema in ankles bilaterally, no clubbing, no cyanosis      Assessment & Plan:   Bronchiectasis with acute exacerbation I completely agree with the six weeks of ceftazidime prescribed by [facility] at [facility].  This seems to have helped.  Plan: -continue ceftaz to complete a six week course -start Tobi inhaled after completing ceftaz -probiotic daily -continue Advair/Spiriva  COPD with chronic bronchitis Continue Advair, Spiriva Flu shot UTD    Updated Medication List Outpatient Encounter Prescriptions as of 09/08/2013  Medication Sig Dispense Refill  . amiodarone (PACERONE) 200 MG tablet Take 1 tablet (200 mg total) by mouth daily.  90 tablet  1  . aspirin 81 MG tablet Take 81 mg by mouth daily.      . benzonatate (TESSALON) 200 MG capsule Take 1 capsule (200 mg total) by mouth 3 (three) times daily as needed for cough.  30 capsule  1  . fluticasone (FLONASE) 50 MCG/ACT nasal spray Place 1 spray into the nose  daily.  16 g  2  . Fluticasone-Salmeterol (ADVAIR) 250-50 MCG/DOSE AEPB Inhale 2 puffs into the lungs every 12 (twelve) hours.  60 each  2  . furosemide (LASIX) 20 MG tablet TAKE 1 TABLET (20 MG TOTAL) BY MOUTH DAILY.  30 tablet  3  . ipratropium-albuterol (DUONEB) 0.5-2.5 (3) MG/3ML SOLN Take 3 mLs by nebulization as needed.      . NON FORMULARY Oxygen 4 liters daily.      Marland Kitchen tiotropium (SPIRIVA) 18 MCG inhalation capsule Place 1 capsule (18 mcg total) into inhaler and inhale daily.  30 capsule  2  . Tobramycin (TOBI PODHALER) 28 MG CAPS Place 4 capsules into inhaler and inhale 2 (two) times daily. For the rest of the month- every other month.  240 capsule  0  . Carlena Hurl  20 MG TABS tablet TAKE 1 TABLET BY MOUTH EVERY DAY  30 tablet  3   No facility-administered encounter medications on file as of 09/08/2013.

## 2013-09-10 ENCOUNTER — Encounter: Payer: Self-pay | Admitting: Cardiovascular Disease

## 2013-09-10 ENCOUNTER — Ambulatory Visit (INDEPENDENT_AMBULATORY_CARE_PROVIDER_SITE_OTHER): Payer: Medicare Other | Admitting: Cardiovascular Disease

## 2013-09-10 VITALS — BP 120/60 | HR 54 | Ht 72.0 in | Wt 133.8 lb

## 2013-09-10 DIAGNOSIS — I4891 Unspecified atrial fibrillation: Secondary | ICD-10-CM

## 2013-09-10 DIAGNOSIS — I480 Paroxysmal atrial fibrillation: Secondary | ICD-10-CM

## 2013-09-10 DIAGNOSIS — J479 Bronchiectasis, uncomplicated: Secondary | ICD-10-CM

## 2013-09-10 DIAGNOSIS — I359 Nonrheumatic aortic valve disorder, unspecified: Secondary | ICD-10-CM

## 2013-09-10 DIAGNOSIS — I5032 Chronic diastolic (congestive) heart failure: Secondary | ICD-10-CM

## 2013-09-10 DIAGNOSIS — I350 Nonrheumatic aortic (valve) stenosis: Secondary | ICD-10-CM

## 2013-09-10 DIAGNOSIS — I509 Heart failure, unspecified: Secondary | ICD-10-CM

## 2013-09-10 NOTE — Patient Instructions (Signed)
You are doing well. No medication changes were made.  If you have worsening leg swelling, take an extra lasix after lunch (twice a day)  Monitor the heart rate at home If it stays in normal rhythm, maybe we can come off the xarelto  Please call us if you have new issues that need to be addressed before your next appt.  Your physician wants you to follow-up in: 6 months.  You will receive a reminder letter in the mail two months in advance. If you don't receive a letter, please call our office to schedule the follow-up appointment.

## 2013-09-10 NOTE — Assessment & Plan Note (Signed)
Mild aortic valve stenosis by echocardiogram in early 2014

## 2013-09-10 NOTE — Assessment & Plan Note (Signed)
He is high risk of pulmonary hypertension and diastolic CHF. We have suggested that he stay on his Lasix daily

## 2013-09-10 NOTE — Assessment & Plan Note (Signed)
He is currently receiving antibiotics, has a PICC line in place

## 2013-09-10 NOTE — Progress Notes (Signed)
Patient ID: TELLY JAWAD, male    DOB: 18-Feb-1931, 79 y.o.   MRN: [id number]  HPI Comments: Mr. Morrical is an 79 year old gentleman with PMHx significant for chronic respiratory failure- O2 dependent COPD with pulmonary HTN, severe bronchiectasis, common variable immunodeficiency snydrome, chronic diastolic CHF and paroxysmal atrial fibrillation (on chronic Xarelto anticoagulation).    COPD exacerbation with atrial fibrillation in February 2014   admitted to [facility] 03/19/13 to 03/24/13 for atrial fibrillation with RVR in the setting of acute on chronic respiratory failure.  EKG revealed atrial fibrillation/flutter with RVR, rate 150s. WBC 25K.  He was started on amiodarone gtt, broad spectrum abx and pulmonary toilet. Outpatient Cardizem PO was continued. Digoxin held. He shortly converted back to NSR, however BPs were labile. Cardizem was held and amiodarone transitioned to PO with sustained NSR. He was restarted on Xarelto. He was discharged on PO amiodarone load (400 mg BID) to maintenance dose (200mg  daily). He did develop mild CHF decompensation which improved with low-dose Lasix.   In followup today, he reports he was in the hospital last week where he received antibiotics for chronic bronchitis/bronchiectasis. PICC line was placed. He did receive 2 units of packed red blood cells for anemia. He had a colonoscopy with 5 polyps removed.  Today he feels better, less short of breath. He continues on his supplemental oxygen. He is maintaining normal sinus rhythm also his hospital course by his report and on today's visit.   normal sinus rhythm in April 2014. No recent hematomas or nosebleeds as in the past,   fall risk per the family.  He does have a chronic cough, stable  Echo 02/2013: LVEF 55-60%, mild AS, mild pulmonary HTN  EKG demonstrates NSR, 54 bpm, borderline 1st degree AVB, no ST/T changes., PVC noted  Outpatient Encounter Prescriptions as of 09/10/2013  Medication Sig Dispense Refill   . amiodarone (PACERONE) 200 MG tablet Take 1 tablet (200 mg total) by mouth daily.  90 tablet  1  . aspirin 81 MG tablet Take 81 mg by mouth daily.      . benzonatate (TESSALON) 200 MG capsule Take 1 capsule (200 mg total) by mouth 3 (three) times daily as needed for cough.  30 capsule  1  . ferrous sulfate 325 (65 FE) MG tablet Take 325 mg by mouth daily with breakfast.      . fluticasone (FLONASE) 50 MCG/ACT nasal spray Place 1 spray into the nose daily.  16 g  2  . Fluticasone-Salmeterol (ADVAIR) 250-50 MCG/DOSE AEPB Inhale 2 puffs into the lungs every 12 (twelve) hours.  60 each  2  . furosemide (LASIX) 20 MG tablet TAKE 1 TABLET (20 MG TOTAL) BY MOUTH DAILY.  30 tablet  3  . ipratropium-albuterol (DUONEB) 0.5-2.5 (3) MG/3ML SOLN Take 3 mLs by nebulization as needed.      . NON FORMULARY Oxygen 4 liters daily.      Marland Kitchen tiotropium (SPIRIVA) 18 MCG inhalation capsule Place 1 capsule (18 mcg total) into inhaler and inhale daily.  30 capsule  2  . traMADol (ULTRAM) 50 MG tablet Take 1 tablet (50 mg total) by mouth every 6 (six) hours as needed (cough).  40 tablet  2  . XARELTO 20 MG TABS tablet TAKE 1 TABLET BY MOUTH EVERY DAY  30 tablet  3  . [DISCONTINUED] Tobramycin (TOBI PODHALER) 28 MG CAPS Place 4 capsules into inhaler and inhale 2 (two) times daily. For the rest of the month- every other month.  240 capsule  0   No facility-administered encounter medications on file as of 09/10/2013.     Review of Systems  Constitutional: Negative.   HENT: Negative.   Eyes: Negative.   Respiratory: Positive for shortness of breath.   Cardiovascular: Negative.   Gastrointestinal: Negative.   Musculoskeletal: Positive for gait problem.  Skin: Negative.   Neurological: Negative.  Negative for dizziness, syncope and light-headedness.  Psychiatric/Behavioral: Negative.   All other systems reviewed and are negative.    BP 120/60  Pulse 54  Ht 6' (1.829 m)  Wt 133 lb 12 oz (60.669 kg)  BMI 18.14  kg/m2  Physical Exam  Nursing note and vitals reviewed. Constitutional: He is oriented to person, place, and time. He appears well-developed and well-nourished.   on oxygen nasal cannula, frail  HENT:  Head: Normocephalic.  Nose: Nose normal.  Mouth/Throat: Oropharynx is clear and moist.  Eyes: Conjunctivae are normal. Pupils are equal, round, and reactive to light.  Neck: Normal range of motion. Neck supple. No JVD present.  Cardiovascular: Normal rate, regular rhythm, S1 normal, S2 normal, normal heart sounds and intact distal pulses.   Occasional extrasystoles are present. Exam reveals no gallop and no friction rub.   No murmur heard. Pulmonary/Chest: Effort normal. No respiratory distress. He has decreased breath sounds. He has no wheezes. He has rales. He exhibits no tenderness.  Abdominal: Soft. Bowel sounds are normal. He exhibits no distension. There is no tenderness.  Musculoskeletal: Normal range of motion. He exhibits no edema and no tenderness.  Lymphadenopathy:    He has no cervical adenopathy.  Neurological: He is alert and oriented to person, place, and time. Coordination normal.  Skin: Skin is warm and dry. No rash noted. No erythema.  Psychiatric: He has a normal mood and affect. His behavior is normal. Judgment and thought content normal.      Assessment and Plan

## 2013-09-10 NOTE — Assessment & Plan Note (Signed)
No recent episodes of atrial fibrillation. Last episode earlier in 2014. He reports being anemic requiring blood transfusion. He is currently on iron. We have suggested that if his blood count continues to drop concerning for GI bleed, that we could hold the Zarelto as he is maintaining normal sinus rhythm. If his blood count comes back showing stable blood count, we could always continue the anticoagulation as long as his gait is stable with no significant falls.

## 2013-11-30 ENCOUNTER — Encounter: Payer: Self-pay | Admitting: Pulmonary Disease

## 2013-12-09 ENCOUNTER — Ambulatory Visit (INDEPENDENT_AMBULATORY_CARE_PROVIDER_SITE_OTHER): Payer: Medicare Other | Admitting: Physician Assistant

## 2013-12-09 ENCOUNTER — Encounter: Payer: Self-pay | Admitting: Physician Assistant

## 2013-12-09 ENCOUNTER — Other Ambulatory Visit: Payer: Self-pay

## 2013-12-09 VITALS — BP 124/60 | HR 64 | Ht 72.0 in | Wt 130.0 lb

## 2013-12-09 DIAGNOSIS — I480 Paroxysmal atrial fibrillation: Secondary | ICD-10-CM | POA: Insufficient documentation

## 2013-12-09 DIAGNOSIS — J9610 Chronic respiratory failure, unspecified whether with hypoxia or hypercapnia: Secondary | ICD-10-CM

## 2013-12-09 DIAGNOSIS — R0789 Other chest pain: Secondary | ICD-10-CM

## 2013-12-09 DIAGNOSIS — I4891 Unspecified atrial fibrillation: Secondary | ICD-10-CM

## 2013-12-09 DIAGNOSIS — R0602 Shortness of breath: Secondary | ICD-10-CM

## 2013-12-09 DIAGNOSIS — I5032 Chronic diastolic (congestive) heart failure: Secondary | ICD-10-CM

## 2013-12-09 DIAGNOSIS — I509 Heart failure, unspecified: Secondary | ICD-10-CM

## 2013-12-09 DIAGNOSIS — R627 Adult failure to thrive: Secondary | ICD-10-CM | POA: Insufficient documentation

## 2013-12-09 MED ORDER — BENZONATATE 200 MG PO CAPS: 200.0000 mg | ORAL_CAPSULE | Freq: Three times a day (TID) | ORAL | Status: AC | PRN

## 2013-12-09 MED ORDER — AMIODARONE HCL 200 MG PO TABS: 200.0000 mg | ORAL_TABLET | Freq: Every day | ORAL | Status: AC

## 2013-12-09 MED ORDER — RIVAROXABAN 20 MG PO TABS: ORAL_TABLET | ORAL | Status: AC

## 2013-12-09 MED ORDER — RIVAROXABAN 20 MG PO TABS: ORAL_TABLET | ORAL | Status: DC

## 2013-12-09 NOTE — Patient Instructions (Signed)
Please follow-up with your primary care provider in 1 week.  Please follow-up with Dr. Kendrick FriesMcQuaid in 1-2 weeks. Please address lung questions, concerns for developing lung infection with your pulmonologist.   Please try Mucinex D for cough, sputum.   Please continue to monitor appetite and strength. If continues to worsen, may need to consider home health services or short term rehab.   If symptoms worsen- weakness, cough, shortness of breath, sputum production- please present to the nearest emergency department.   We will see you back in 3 months.

## 2013-12-09 NOTE — Assessment & Plan Note (Signed)
Stable. Euvolemic on exam. Continues to take Lasix daily. Will check electrolytes, renal function today.

## 2013-12-09 NOTE — Assessment & Plan Note (Addendum)
Discussed several options today regarding ongoing care for the patient. He does not take his medications reliably at home despite the urging of his wife and daughter. He does not have much of an appetite and has been losing weight. Discussed home health services vs short term rehab facility. Advised the importance of medication adherence and following with his PCP. Appetite stimulate could be one option. Concern for developing pulmonary infection attributing to weakness, anorexia. The patient would like to consider home health options and discuss further with his family.

## 2013-12-09 NOTE — Telephone Encounter (Signed)
Requested Prescriptions   Signed Prescriptions Disp Refills  . Rivaroxaban (XARELTO) 20 MG TABS tablet 30 tablet 3    Sig: TAKE 1 TABLET BY MOUTH EVERY DAY    Authorizing Provider: Antonieta IbaGOLLAN, TIMOTHY J    Ordering User: Kendrick FriesLOPEZ, MARINA C

## 2013-12-09 NOTE — Assessment & Plan Note (Addendum)
The patient's breathing appears to be at baseline. He is euvolemic on exam. Weight has been stable-- he has actually lost a little weight from anorexia. He does note a nocturnal cough productive of grey sputum at times. This is not too far from his baseline per patient. No fevers or chills at home. Monitor for recurrent acute exacerbation of bronchiectasis contributing to loss of appetite and weakness. Recommended mucolytic. Will check CBC and CMP today. Advised follow-up with Dr. Kendrick FriesMcQuaid. If respiratory status, cough, weakness or anorexia worsen, advised to present to the ED before then.

## 2013-12-09 NOTE — Progress Notes (Signed)
Patient ID: Johnny Wilson, male   DOB: 1931-04-18, 80 y.o.   MRN: [id number]   Date:  12/09/2013   ID:  Johnny Wilson, DOB [birth year], MRN [id number]  PCP:  Lauro Regulus., MD  Primary Cardiologist:  Concha Se, MD   History of Present Illness:  Johnny Wilson is a 80 y.o. male PMHx significant for chronic respiratory failure- O2 dependent COPD with pulmonary HTN, severe bronchiectasis, common variable immunodeficiency snydrome, chronic diastolic CHF and paroxysmal atrial fibrillation (on chronic Xarelto anticoagulation).  Last seen in follow-up 08/2013. He had been placed on 6 weeks of IV antibiotics via PICC for acute exacerbation of bronchiectasis. He is followed by Dr. Kendrick Fries. This is usually manifested as productive cough and worsening shortness of breath.  He is accompanied today by his daughter and wife. They are concerned about his progressive weakness. SOB/DOE has been unchanged. He still wears supplemental O2 at home. No weight increase or LE edema. His appetite is reduced. He has developed a more frequent cough, particularly at night when laying supine, productive of grey sputum. No chest pain or palpitations. He does note occasional dark stools, no frank blood. He continues to take iron, and Xarelto. The family's concerns are primarily regarding his progressive decline over the past few weeks. He actually had an appointment with his PCP today, but his wife was concerned he would catch the flu.   EKG: NSR, 64 bpm, no ST/T changes  Wt Readings from Last 3 Encounters:  12/09/13 130 lb (58.968 kg)  09/10/13 133 lb 12 oz (60.669 kg)  09/08/13 134 lb (60.782 kg)     Past Medical History  Diagnosis Date  . COPD (chronic obstructive pulmonary disease)   . CHF (congestive heart failure)   . Bronchiectasis   . CAD (coronary artery disease)   . History of kidney stones   . A-fib   . Colon polyps     Current Outpatient Prescriptions  Medication Sig Dispense Refill  .  amiodarone (PACERONE) 200 MG tablet Take 1 tablet (200 mg total) by mouth daily.  90 tablet  1  . benzonatate (TESSALON) 200 MG capsule Take 1 capsule (200 mg total) by mouth 3 (three) times daily as needed for cough.  90 capsule  1  . ferrous sulfate 325 (65 FE) MG tablet Take 325 mg by mouth daily with breakfast.      . fluticasone (FLONASE) 50 MCG/ACT nasal spray Place 1 spray into the nose daily.  16 g  2  . Fluticasone-Salmeterol (ADVAIR) 250-50 MCG/DOSE AEPB Inhale 2 puffs into the lungs every 12 (twelve) hours.  60 each  2  . furosemide (LASIX) 20 MG tablet TAKE 1 TABLET (20 MG TOTAL) BY MOUTH DAILY.  30 tablet  3  . ipratropium-albuterol (DUONEB) 0.5-2.5 (3) MG/3ML SOLN Take 3 mLs by nebulization as needed.      . NON FORMULARY Oxygen 4 liters daily.      . Rivaroxaban (XARELTO) 20 MG TABS tablet TAKE 1 TABLET BY MOUTH EVERY DAY  90 tablet  3  . tiotropium (SPIRIVA) 18 MCG inhalation capsule Place 1 capsule (18 mcg total) into inhaler and inhale daily.  30 capsule  2  . traMADol (ULTRAM) 50 MG tablet Take 1 tablet (50 mg total) by mouth every 6 (six) hours as needed (cough).  40 tablet  2   No current facility-administered medications for this visit.    Allergies:   No Known Allergies  Social History:  The patient  reports  that he quit smoking about 10 years ago. His smoking use included Cigarettes. He has a 82.5 pack-year smoking history. He has quit using smokeless tobacco. His smokeless tobacco use included Chew. He reports that he does not drink alcohol or use illicit drugs.   Family History:  Family History  Problem Relation Age of Onset  . Family history unknown: Yes    Review of Systems: General: negative for chills, fever, night sweats or weight changes.  Cardiovascular: positive for chronic SOB/DOE, negative for chest pain,  edema, orthopnea, palpitations, paroxysmal nocturnal dyspnea Dermatological: negative for rash Respiratory: positive for productive cough, negative  for wheezing Urologic: negative for hematuria Abdominal: negative for nausea, vomiting, diarrhea, bright red blood per rectum, melena, or hematemesis Neurologic: negative for visual changes, syncope, or dizziness All other systems reviewed and are otherwise negative except as noted above.  PHYSICAL EXAM: VS:  BP 124/60  Pulse 64  Ht 6' (1.829 m)  Wt 130 lb (58.968 kg)  BMI 17.63 kg/m2 Cachectic appearing male wearing nasal cannula, in no acute distress HEENT: normal, PERRL Neck: no JVD or bruits Cardiac:  normal S1, S2; RRR; no murmur or gallops Lungs:  Faint wheeze in RML field, no rhonchi or rales Abd: soft, nontender, no hepatomegaly, normoactive BS x 4 quads Ext: no edema, cyanosis or clubbing Skin: warm and dry, cap refill < 2 sec Neuro:  CNs 2-12 intact, no focal abnormalities noted Musculoskeletal: strength and tone appropriate for age  Psych: normal affect

## 2013-12-10 LAB — COMPREHENSIVE METABOLIC PANEL
A/G RATIO: 1.3 (ref 1.1–2.5)
ALT: 9 IU/L (ref 0–44)
AST: 15 IU/L (ref 0–40)
Albumin: 3.2 g/dL — ABNORMAL LOW (ref 3.5–4.7)
Alkaline Phosphatase: 119 IU/L — ABNORMAL HIGH (ref 39–117)
BUN/Creatinine Ratio: 31 — ABNORMAL HIGH (ref 10–22)
BUN: 29 mg/dL — ABNORMAL HIGH (ref 8–27)
CALCIUM: 9.1 mg/dL (ref 8.6–10.2)
CHLORIDE: 98 mmol/L (ref 97–108)
CO2: 36 mmol/L — ABNORMAL HIGH (ref 18–29)
Creatinine, Ser: 0.94 mg/dL (ref 0.76–1.27)
GFR calc Af Amer: 88 mL/min/{1.73_m2} (ref >59)
GFR calc non Af Amer: 76 mL/min/{1.73_m2} (ref >59)
GLUCOSE: 104 mg/dL — AB (ref 65–99)
Globulin, Total: 2.5 g/dL (ref 1.5–4.5)
POTASSIUM: 5.9 mmol/L — AB (ref 3.5–5.2)
SODIUM: 147 mmol/L — AB (ref 134–144)
Total Bilirubin: 0.3 mg/dL (ref 0.0–1.2)
Total Protein: 5.7 g/dL — ABNORMAL LOW (ref 6.0–8.5)

## 2013-12-14 ENCOUNTER — Telehealth: Payer: Self-pay

## 2013-12-14 ENCOUNTER — Ambulatory Visit (INDEPENDENT_AMBULATORY_CARE_PROVIDER_SITE_OTHER): Payer: Medicare Other

## 2013-12-14 DIAGNOSIS — J449 Chronic obstructive pulmonary disease, unspecified: Secondary | ICD-10-CM

## 2013-12-14 DIAGNOSIS — I509 Heart failure, unspecified: Secondary | ICD-10-CM

## 2013-12-14 DIAGNOSIS — R627 Adult failure to thrive: Secondary | ICD-10-CM

## 2013-12-14 NOTE — Telephone Encounter (Signed)
Pt's wife left message on voicemail that Dr. Dareen PianoAnderson cannot see him until 01/05/14 and she would like to bring pt to our office for lab draw. Pt sched to come in this afternoon at 2:30.

## 2013-12-14 NOTE — Telephone Encounter (Signed)
Attempted to contact pt to have him come in for labs today or tomorrow.  No answer, no machine at home #.  Will call again later.

## 2013-12-14 NOTE — Telephone Encounter (Signed)
Spoke w/ pt's wife.  She reports that pt is feeling better today. She reports that she has left a message w/ pt's PCP for an appt. Advised her that if pt is unable to get an appt w/ them today or tomorrow, pt needs to come in here to our office for labs. She states that she will call and let us know if he is coming here or will have labs drawn w/ Dr. Dareen PianoAnderson.

## 2013-12-14 NOTE — Telephone Encounter (Signed)
Message copied by Marilynne HalstedMOODY, Charistopher Rumble R on Mon Dec 14, 2013  8:27 AM ------      Message from: Odella AquasARGUELLO, ROGER A      Created: Mon Dec 14, 2013  7:50 AM       Can we check a repeat BMET today or tomorrow? Can we also make sure he has a follow-up appointment with his PCP in regard to progressive weight loss, malnutrition soon (1-2 weeks)? I'd like him to consider options for long term care, nutrition needs, etc. Not taking meds consistently, appetite more, etc. He should stick to this appointment (wife did not take him last week due to concern of catching the flu). Thanks            ----- Message -----         From: SYSTEM         Sent: 12/14/2013  12:10 AM           To: Gery Prayoger A Arguello, PA-C                   ------

## 2013-12-15 LAB — BASIC METABOLIC PANEL
BUN/Creatinine Ratio: 36 — ABNORMAL HIGH (ref 10–22)
BUN: 30 mg/dL — ABNORMAL HIGH (ref 8–27)
CO2: 35 mmol/L — ABNORMAL HIGH (ref 18–29)
Calcium: 9 mg/dL (ref 8.6–10.2)
Chloride: 95 mmol/L — ABNORMAL LOW (ref 97–108)
Creatinine, Ser: 0.84 mg/dL (ref 0.76–1.27)
GFR calc Af Amer: 95 mL/min/{1.73_m2} (ref >59)
GFR calc non Af Amer: 82 mL/min/{1.73_m2} (ref >59)
Glucose: 99 mg/dL (ref 65–99)
Potassium: 4.4 mmol/L (ref 3.5–5.2)
Sodium: 146 mmol/L — ABNORMAL HIGH (ref 134–144)

## 2013-12-17 ENCOUNTER — Telehealth: Payer: Self-pay

## 2013-12-17 DIAGNOSIS — I509 Heart failure, unspecified: Secondary | ICD-10-CM

## 2013-12-17 DIAGNOSIS — Z9181 History of falling: Secondary | ICD-10-CM

## 2013-12-17 DIAGNOSIS — J449 Chronic obstructive pulmonary disease, unspecified: Secondary | ICD-10-CM

## 2013-12-17 DIAGNOSIS — R627 Adult failure to thrive: Secondary | ICD-10-CM

## 2013-12-17 NOTE — Telephone Encounter (Signed)
Spoke w/ Dorina HoyerJoanie, pt's daughter.  She reports that she feels that pt is declining rapidly. She states that she does not want him to come out for another visit, as she is unsure if there is anything we can do for him. She reports that home health was discussed at last ov and she is interested in starting this now.  She would like for them to come out as soon as possible to evaluate pt for what can be done to assist him. She reports that pt's wife is having a hard time adjusting to pt's illness and is requesting some help. Advised her that I would put the order in for [facility] to come out and evaluate pt. She reports that if pt's condition worsens, she will take him to the ED. She is very appreciative and will call w/ further questions or concerns.

## 2013-12-18 ENCOUNTER — Telehealth: Payer: Self-pay

## 2013-12-18 NOTE — Telephone Encounter (Signed)
Pt daughter called and has some questions regarding pt home health. She states they are supposed to come out today.

## 2013-12-18 NOTE — Telephone Encounter (Signed)
Spoke w/ Johnny Wilson.  Advised her that due to ins and staffing reasons, [facility] is unable to accept pt's referral. Spoke w/ Dr. Mariah MillingGollan and am [facility] in process [facility] Also advised her that she can present pt to ED for possible admission if sx worsen and on discharge they will make arrangements, or she can call Dr. Dareen PianoAnderson and speak w/ him about possible Hospice referral. Advised her that we do not refer pt's to SNFs, as this is done via pt preference.  She is agreeable to this and asks that I continue w/ referral to [facility] in hopes that pt can be seen today. Faxed referral to [facility] at [phone].

## 2013-12-29 ENCOUNTER — Other Ambulatory Visit: Payer: Self-pay

## 2013-12-30 DIAGNOSIS — R627 Adult failure to thrive: Secondary | ICD-10-CM

## 2013-12-30 DIAGNOSIS — I5033 Acute on chronic diastolic (congestive) heart failure: Secondary | ICD-10-CM

## 2013-12-30 DIAGNOSIS — J9620 Acute and chronic respiratory failure, unspecified whether with hypoxia or hypercapnia: Secondary | ICD-10-CM

## 2013-12-30 DIAGNOSIS — I4891 Unspecified atrial fibrillation: Secondary | ICD-10-CM

## 2014-01-17 DEATH — deceased

## 2014-03-13 IMAGING — CR DG CHEST 2V
1 series · 2 of 2 positions shown · non-contrast
Comparison: none

REASON FOR EXAM: chest pain
COMMENTS:   May transport without cardiac monitor

PROCEDURE:     DXR - DXR CHEST PA (OR AP) AND LATERAL  - March 19, 2013 [DATE]
RESULT:     Comparison: 02/06/2013, CT of the chest 02/06/2013

[Series 1: w chest pa · 0.14mm/px · 2 of 2 slices shown]
[im 1/2]
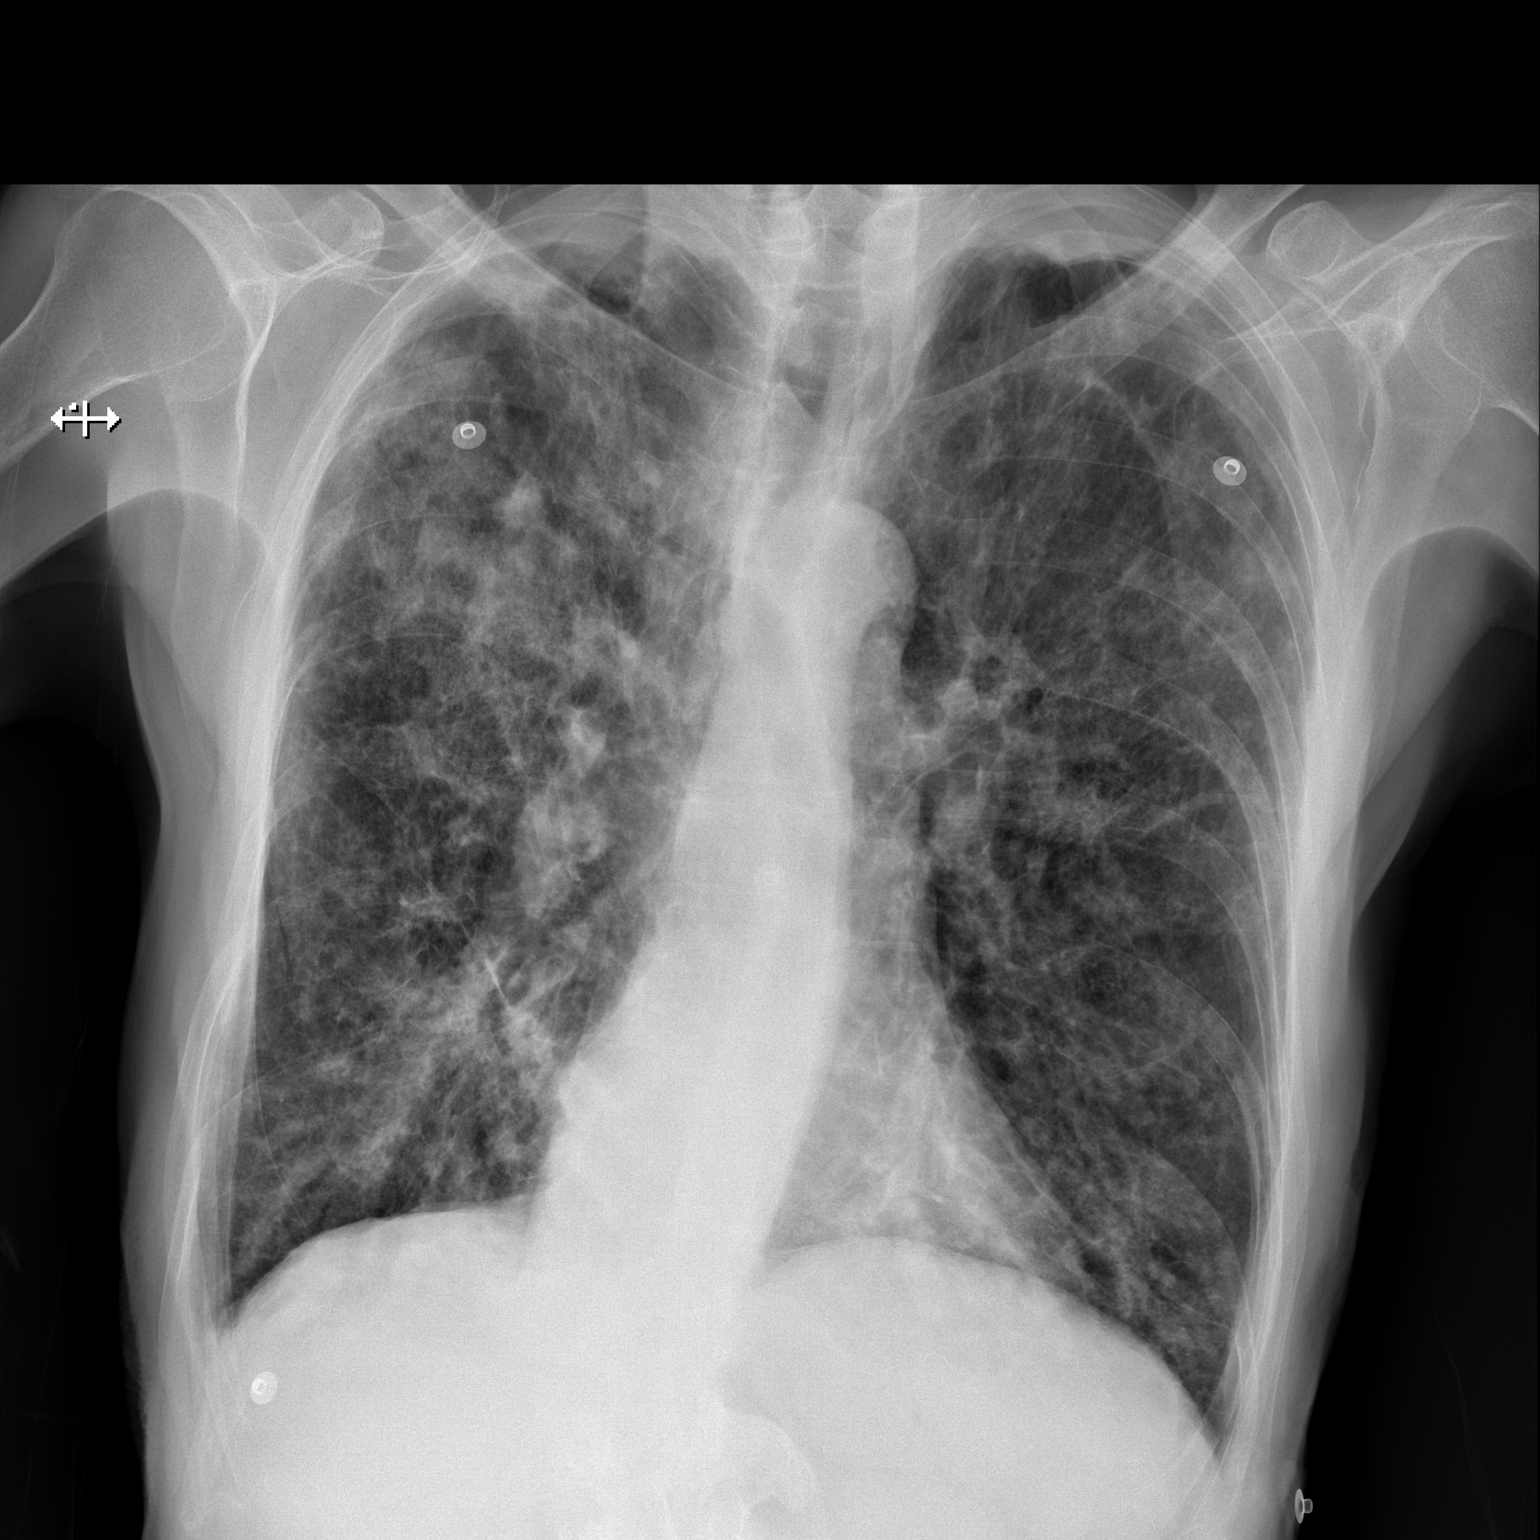
[im 2/2]
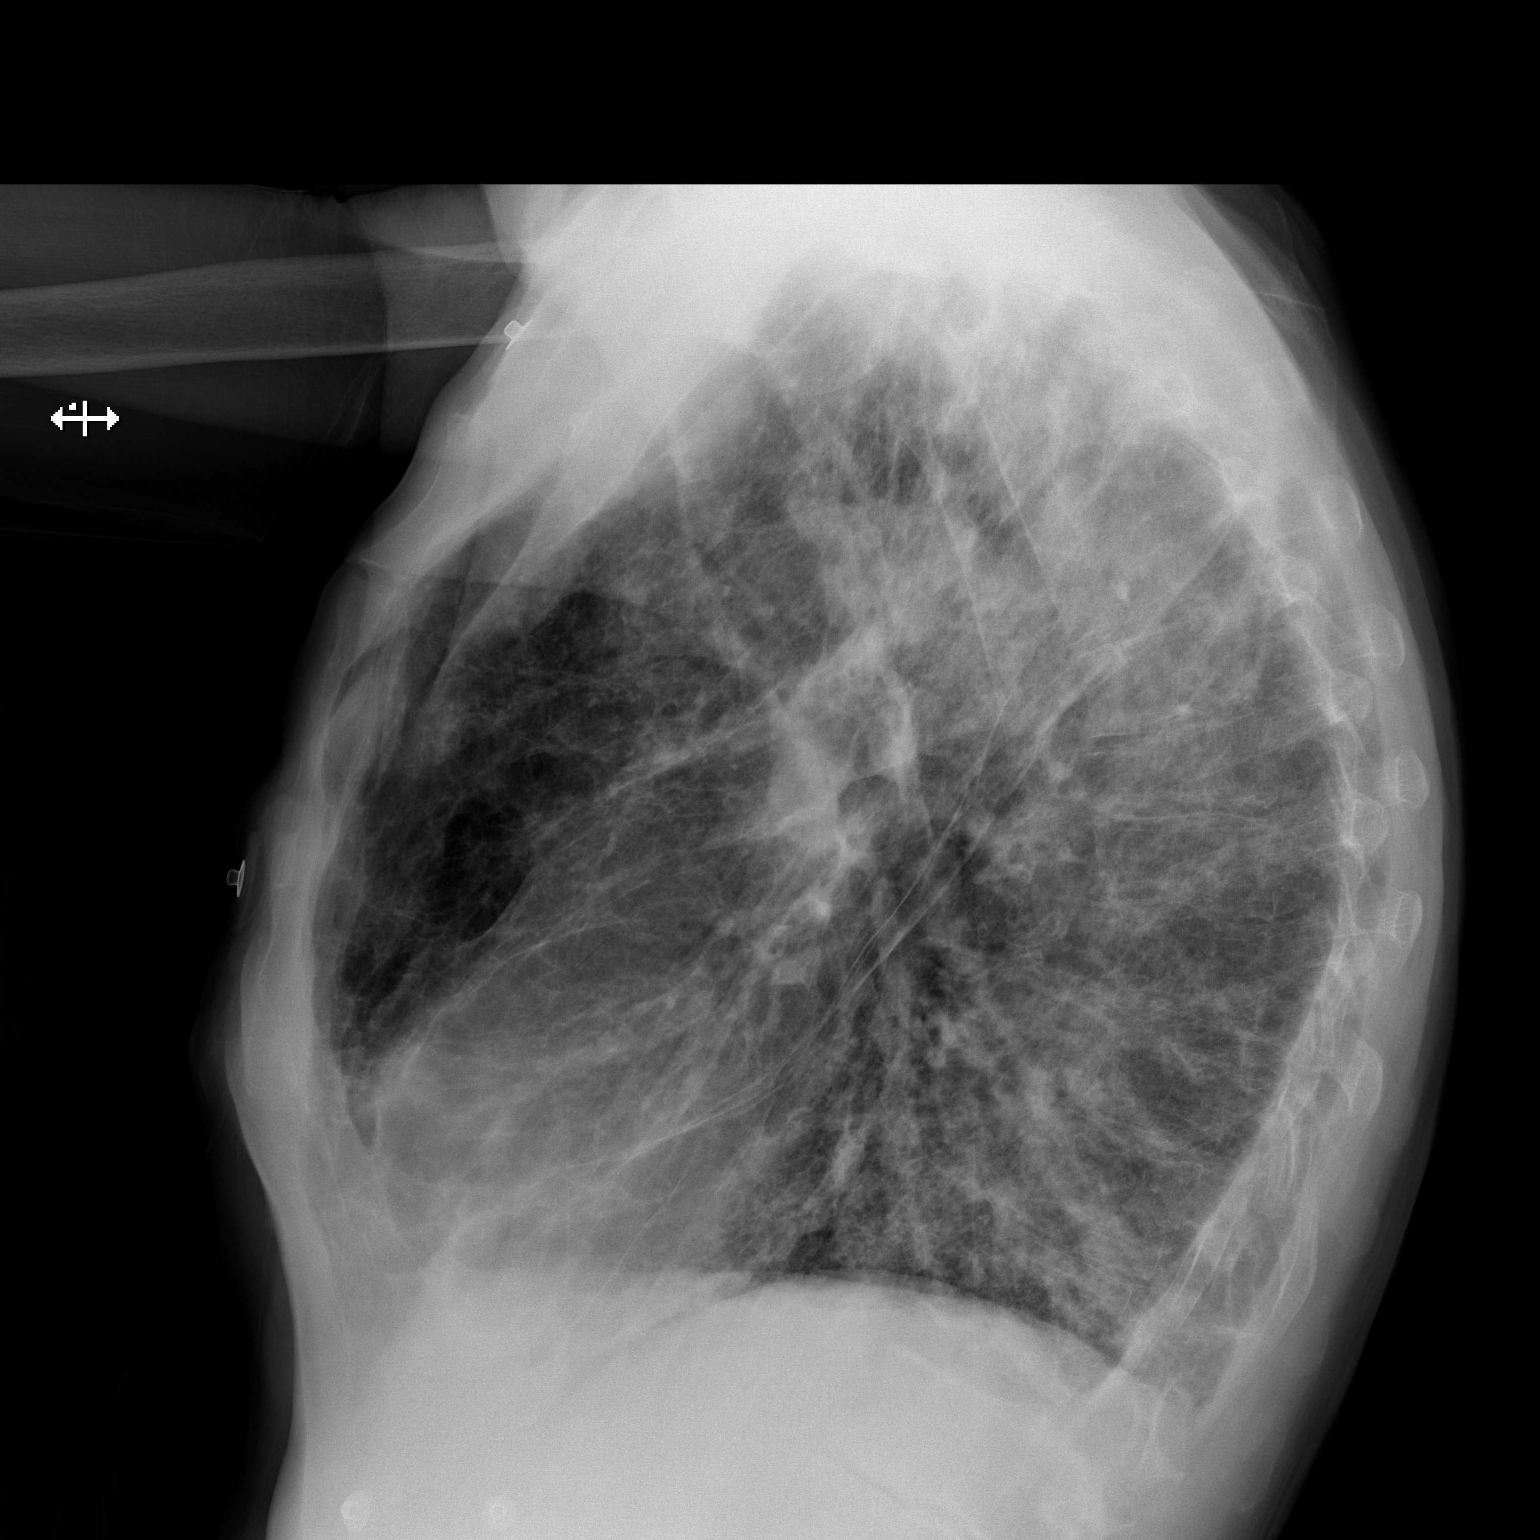

[2 of 2 positions shown; findings below may reference images not displayed]

FINDINGS: The heart and mediastinum are stable. There are bilateral coarse and
heterogeneous opacities throughout the lungs, which were seen on the prior
study. These appear increased from prior in the mid and upper right lung.
Biapical pleural-parenchymal thickening is similar to prior. The lungs are
hyperinflated.
IMPRESSION: Interval increase in heterogeneous opacities in the mid and upper right
lung. This could represent superimposed infection on a background of the
patient's chronic lung disease. Followup chest radiograph is recommended.

[REDACTED]

## 2014-05-26 IMAGING — CT CT CHEST W/O CM
1 of 2 series · 14 of 32 positions shown, 18 images · non-contrast
Comparison: none

REASON FOR EXAM: lung nodule
COMMENTS:

[Series 2: chest w/o 3.0 i31f 2 · axial · non-contrast · 0.75mm/px · z∈[-658,-352]mm · 14 of 122 slices shown, 18 images]
[im 10/122  mediastinal]
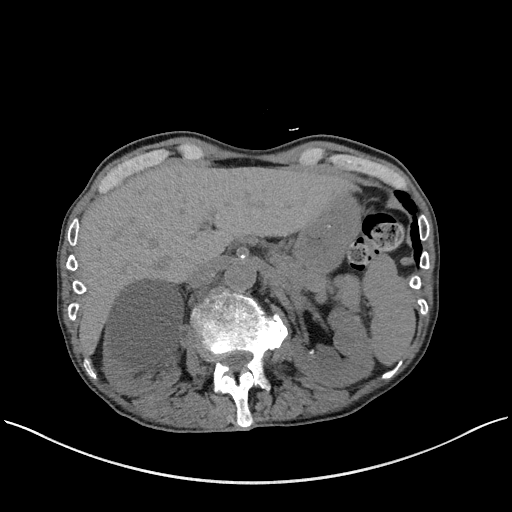
[im 10/122  lung]
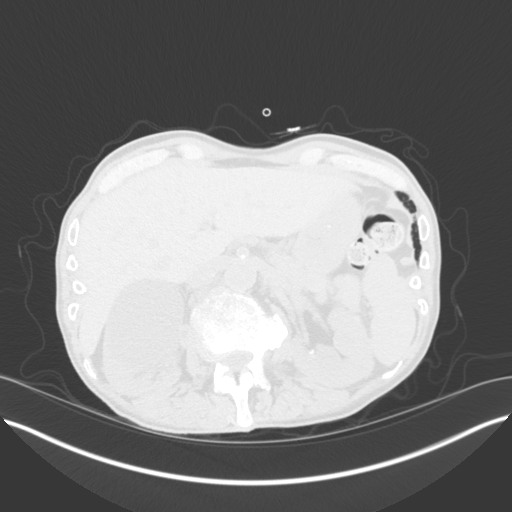
[im 19/122  lung]
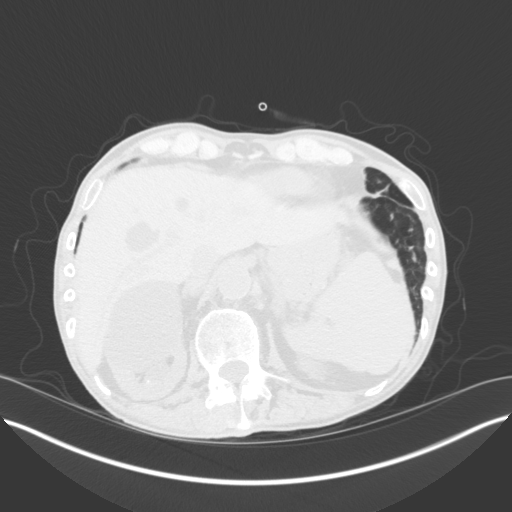
[im 28/122  lung]
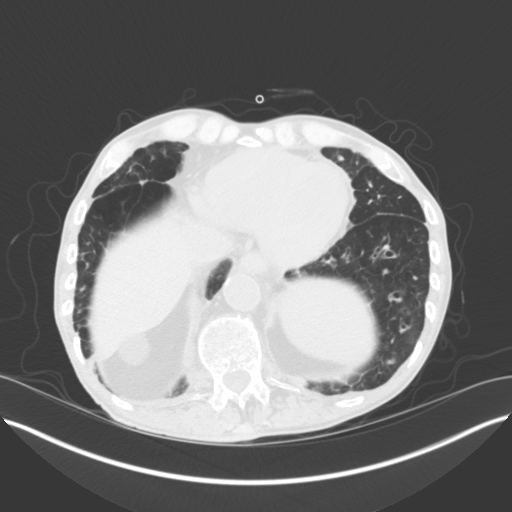
[im 38/122  lung]
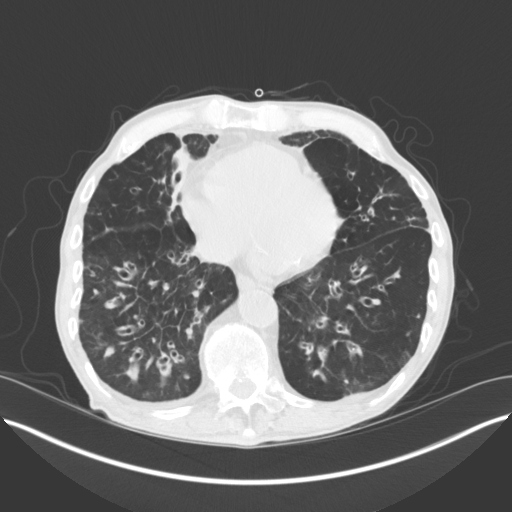
[im 47/122  mediastinal]
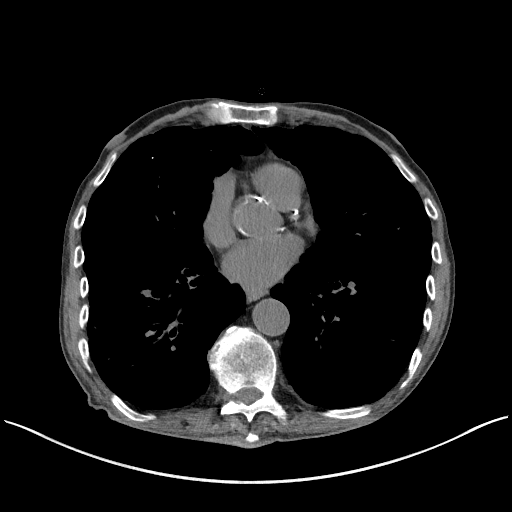
[im 47/122  lung]
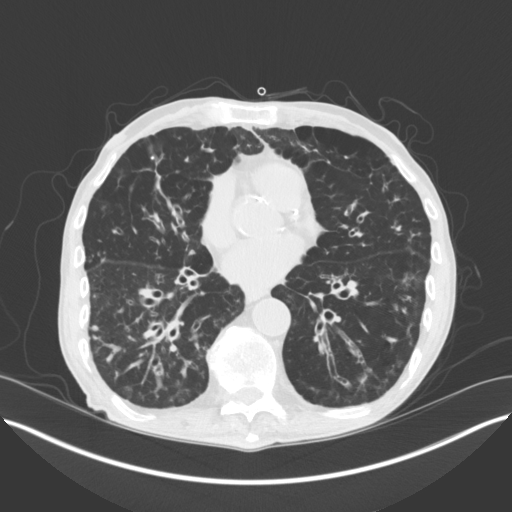
[im 56/122  lung]
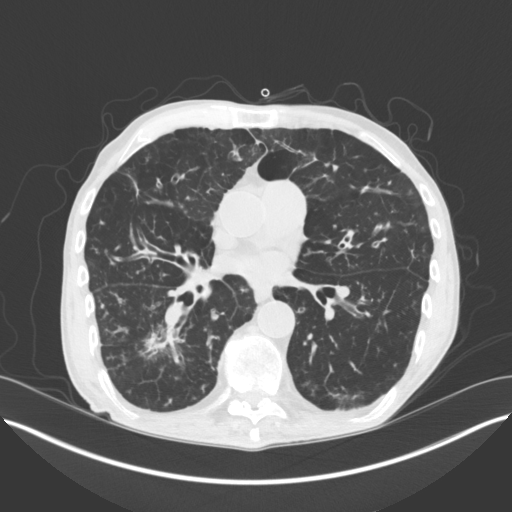
[im 59/122  lung]
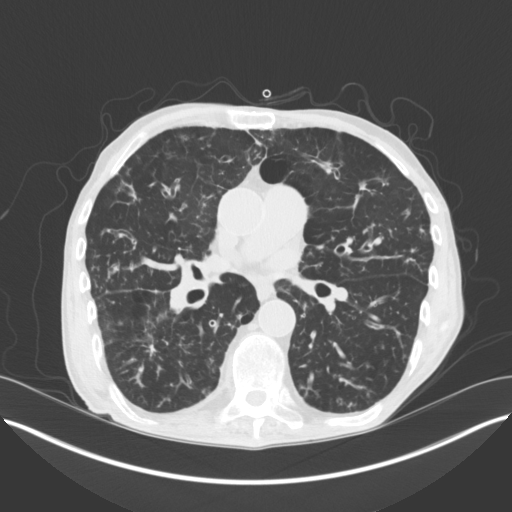
[im 61/122  lung]
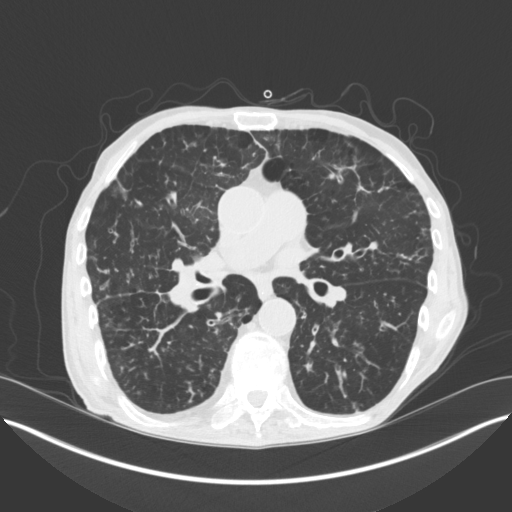
[im 66/122  mediastinal]
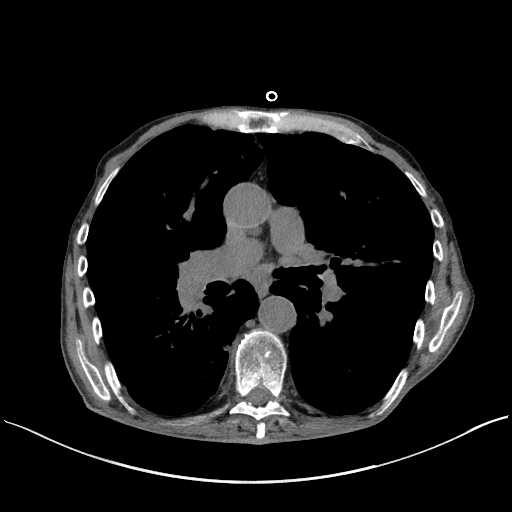
[im 66/122  lung]
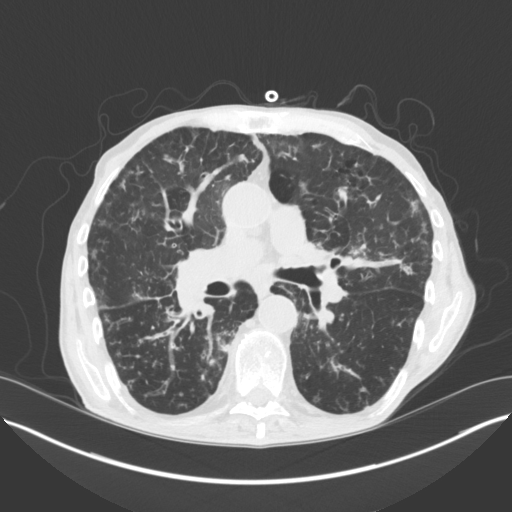
[im 75/122  lung]
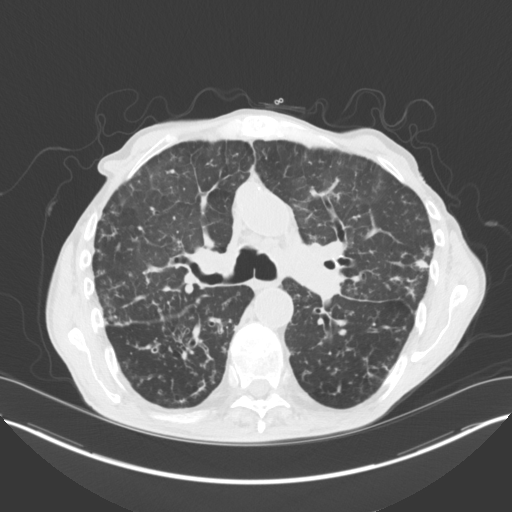
[im 84/122  lung]
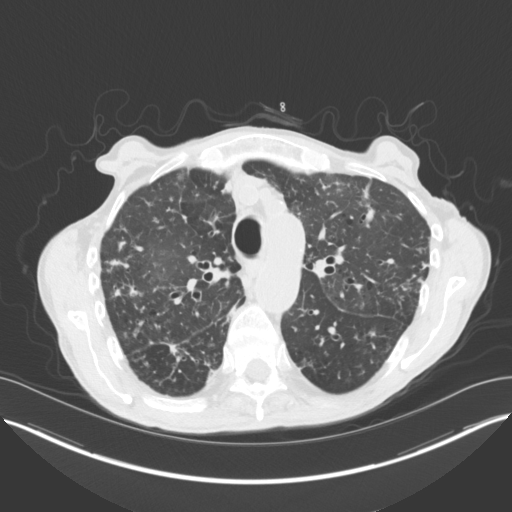
[im 94/122  lung]
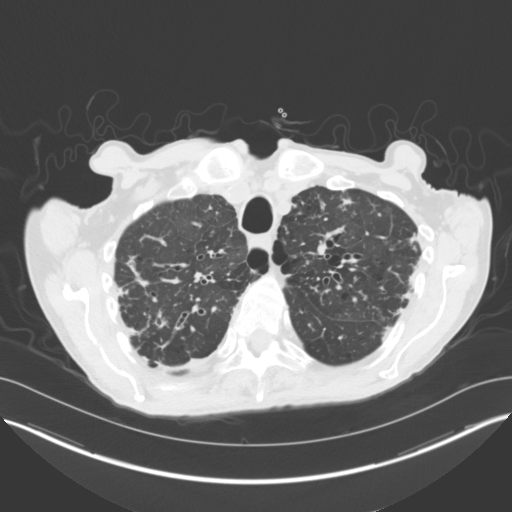
[im 103/122  mediastinal]
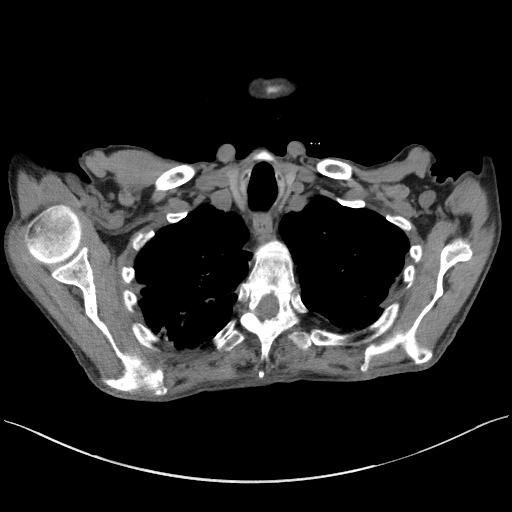
[im 103/122  lung]
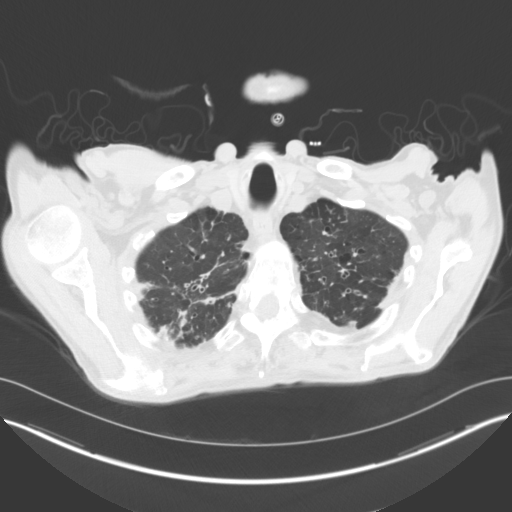
[im 112/122  lung]
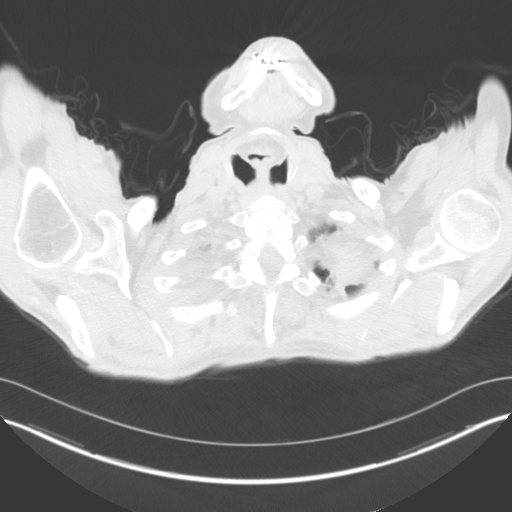

[14 of 32 positions shown; findings below may reference images not displayed]

PROCEDURE:     KCT - KCT CHEST WITHOUT CONTRAST  - June 01, 2013 [DATE]

RESULT:     Axial noncontrast CT scanning was performed through the chest
with reconstructions at 3 mm intervals and slice thicknesses. Review of
multiplanar reconstructed images was performed separately on the VIA
monitor. Comparison is made to a previous study dated February 06, 2013.

At lung window settings there are extensive bronchiectatic changes
diffusely. There is persistent peribronchial cuffing in the right lower lobe
with surrounding parenchymal soft tissue density on images 67 through
74. This has not appreciably changed since the previous study. There is
apical pleural scarring bilaterally with prominent bronchiectatic change on
the right. No discrete pulmonary parenchymal masses are demonstrated but
there are are scattered areas of patchy nodularity which may measure up to
millimeters in diameter which appears stable. An area of previous
parenchymal interstitial density inferiorly in the left upper lobe has
cleared.

At mediastinal window settings the cardiac chambers are not enlarged. There
are coronary artery calcifications. The caliber of the thoracic aorta is
normal. There is soft tissue fullness in the right hilar region which
appears to be related to pulmonary vessels based on the appearance of the
mediastinum on the previous contrast enhanced exam, but small mildly
enlarged lymph nodes cannot be excluded. There is no pleural effusion.

Within the upper abdomen there are hypodensities within the liver and
visualized portions of the kidneys most compatible with cysts. There is no
splenomegaly. No adrenal masses are demonstrated.
IMPRESSION: 1. There has been improvement in the appearance both lungs especially in the
previous area of parenchymal consolidation inferiorly in the left upper
lobe. A stable area of consolidation in the right lower lobe persists.
Severe extensive bronchiectatic changes are present bilaterally.
2. There are likely borderline to mildly enlarged hilar lymph nodes
bilaterally.
3. There is no pleural effusion.
4. There are hypodensities in the liver and visualized portions of the
kidneys most compatible with stable cysts.

[REDACTED]
